# Patient Record
Sex: Female | Born: 1950 | Race: White | Hispanic: No | Smoking: Former smoker
Health system: Southern US, Community
[De-identification: ages and names within clinical notes are randomized; demographics above are authoritative.]

## PROBLEM LIST (undated history)

## (undated) DIAGNOSIS — K5730 Diverticulosis of large intestine without perforation or abscess without bleeding: Secondary | ICD-10-CM

## (undated) DIAGNOSIS — Q676 Pectus excavatum: Secondary | ICD-10-CM

## (undated) DIAGNOSIS — Z1509 Genetic susceptibility to other malignant neoplasm: Secondary | ICD-10-CM

## (undated) DIAGNOSIS — M8580 Other specified disorders of bone density and structure, unspecified site: Secondary | ICD-10-CM

## (undated) DIAGNOSIS — C801 Malignant (primary) neoplasm, unspecified: Secondary | ICD-10-CM

## (undated) DIAGNOSIS — N83209 Unspecified ovarian cyst, unspecified side: Secondary | ICD-10-CM

## (undated) DIAGNOSIS — D869 Sarcoidosis, unspecified: Secondary | ICD-10-CM

## (undated) DIAGNOSIS — D649 Anemia, unspecified: Secondary | ICD-10-CM

## (undated) DIAGNOSIS — M810 Age-related osteoporosis without current pathological fracture: Secondary | ICD-10-CM

## (undated) DIAGNOSIS — M4850XA Collapsed vertebra, not elsewhere classified, site unspecified, initial encounter for fracture: Secondary | ICD-10-CM

## (undated) HISTORY — DX: Pectus excavatum: Q67.6

## (undated) HISTORY — DX: Anemia, unspecified: D64.9

## (undated) HISTORY — DX: Diverticulosis of large intestine without perforation or abscess without bleeding: K57.30

## (undated) HISTORY — DX: Other specified disorders of bone density and structure, unspecified site: M85.80

## (undated) HISTORY — PX: BREAST SURGERY: SHX581

## (undated) HISTORY — PX: TONSILLECTOMY: SUR1361

## (undated) HISTORY — PX: OTHER SURGICAL HISTORY: SHX169

## (undated) HISTORY — DX: Sarcoidosis, unspecified: D86.9

## (undated) HISTORY — DX: Malignant (primary) neoplasm, unspecified: C80.1

## (undated) HISTORY — DX: Collapsed vertebra, not elsewhere classified, site unspecified, initial encounter for fracture: M48.50XA

## (undated) HISTORY — DX: Unspecified ovarian cyst, unspecified side: N83.209

## (undated) HISTORY — DX: Genetic susceptibility to other malignant neoplasm: Z15.09

## (undated) HISTORY — PX: WRIST SURGERY: SHX841

---
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
## 1988-11-19 HISTORY — PX: OOPHORECTOMY: SHX86

## 2001-03-05 ENCOUNTER — Other Ambulatory Visit: Admission: RE | Admit: 2001-03-05 | Discharge: 2001-03-05 | Payer: Self-pay | Admitting: Obstetrics and Gynecology

## 2002-03-11 ENCOUNTER — Other Ambulatory Visit: Admission: RE | Admit: 2002-03-11 | Discharge: 2002-03-11 | Payer: Self-pay | Admitting: Obstetrics and Gynecology

## 2003-04-12 ENCOUNTER — Other Ambulatory Visit: Admission: RE | Admit: 2003-04-12 | Discharge: 2003-04-12 | Payer: Self-pay | Admitting: Obstetrics and Gynecology

## 2003-08-03 ENCOUNTER — Encounter: Payer: Self-pay | Admitting: Internal Medicine

## 2004-04-20 ENCOUNTER — Other Ambulatory Visit: Admission: RE | Admit: 2004-04-20 | Discharge: 2004-04-20 | Payer: Self-pay | Admitting: Obstetrics and Gynecology

## 2005-01-22 ENCOUNTER — Ambulatory Visit: Payer: Self-pay | Admitting: Internal Medicine

## 2005-01-29 ENCOUNTER — Ambulatory Visit: Payer: Self-pay | Admitting: Internal Medicine

## 2005-06-06 ENCOUNTER — Other Ambulatory Visit: Admission: RE | Admit: 2005-06-06 | Discharge: 2005-06-06 | Payer: Self-pay | Admitting: Obstetrics and Gynecology

## 2005-08-20 ENCOUNTER — Ambulatory Visit: Payer: Self-pay | Admitting: Internal Medicine

## 2005-08-27 ENCOUNTER — Ambulatory Visit: Payer: Self-pay | Admitting: Internal Medicine

## 2005-12-07 ENCOUNTER — Ambulatory Visit: Payer: Self-pay | Admitting: Gastroenterology

## 2005-12-12 ENCOUNTER — Ambulatory Visit: Payer: Self-pay | Admitting: Internal Medicine

## 2005-12-17 ENCOUNTER — Ambulatory Visit: Payer: Self-pay | Admitting: Gastroenterology

## 2005-12-17 ENCOUNTER — Encounter: Payer: Self-pay | Admitting: Internal Medicine

## 2005-12-17 HISTORY — PX: COLONOSCOPY: SHX174

## 2006-01-02 ENCOUNTER — Ambulatory Visit: Payer: Self-pay | Admitting: Internal Medicine

## 2006-01-09 ENCOUNTER — Ambulatory Visit: Payer: Self-pay | Admitting: Cardiology

## 2006-04-13 IMAGING — CT CT CHEST W/ CM
2 of 4 series · 15 of 36 positions shown, 18 images · IV contrast (omnipaque)
Comparison: None.

CLINICAL DATA: Possible sarcoidosis.  
 CHEST CT WITH CONTRAST:
TECHNIQUE: Multidetector CT imaging of the chest was performed following the standard protocol during bolus administration of intravenous contrast.
 Contrast:  80 cc Omnipaque 300

[Series 2: chest_routine 5.0 b40f st · axial · 0.62mm/px · z∈[-316,-46]mm · 12 of 64 slices shown, 15 images]
[im 5/64  mediastinal]
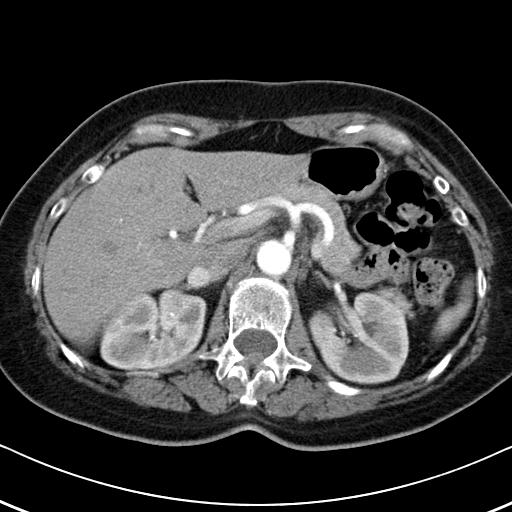
[im 5/64  lung]
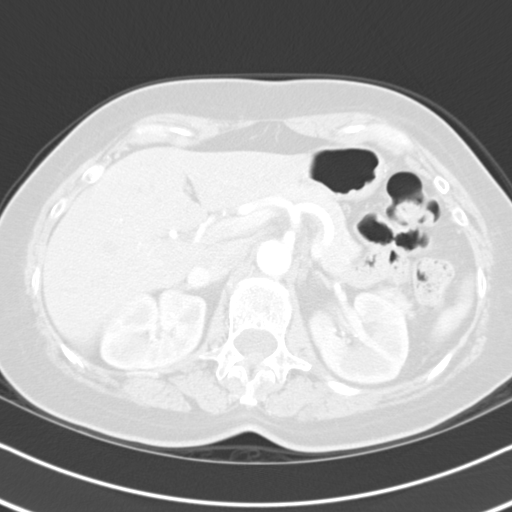
[im 10/64  lung]
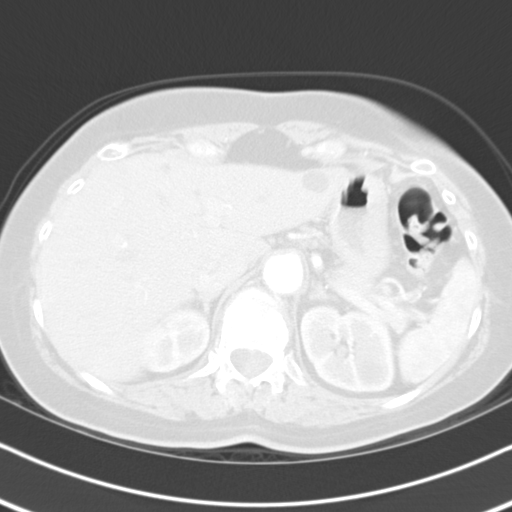
[im 14/64  lung]
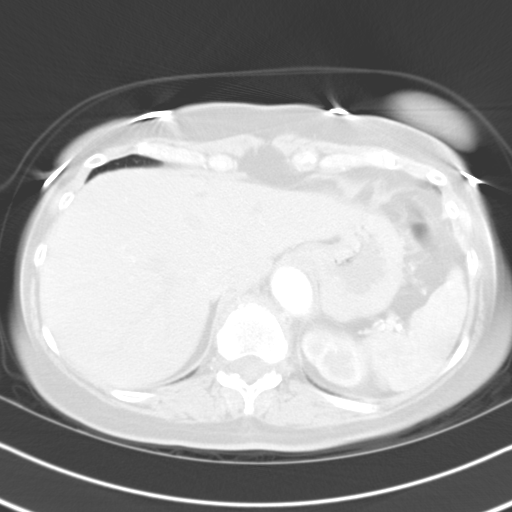
[im 19/64  lung]
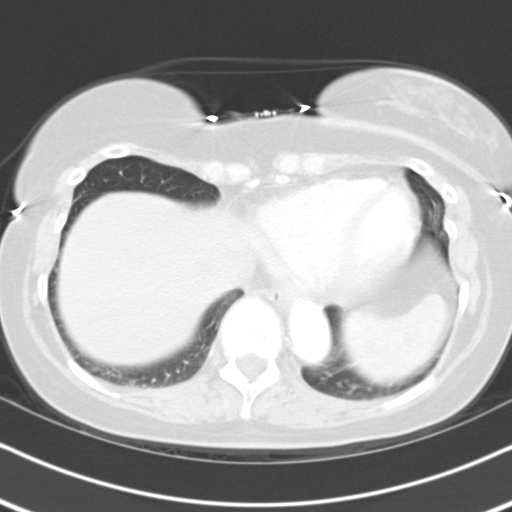
[im 23/64  mediastinal]
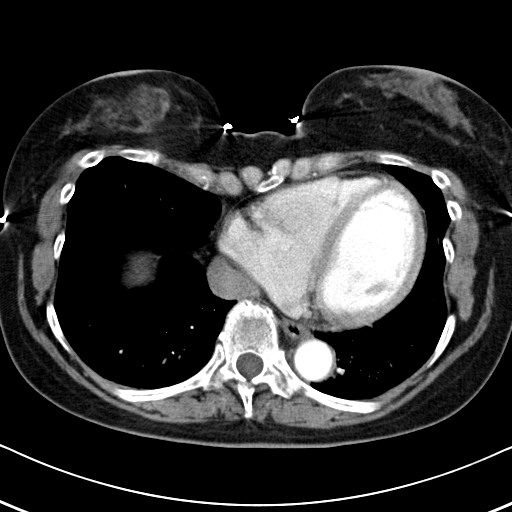
[im 23/64  lung]
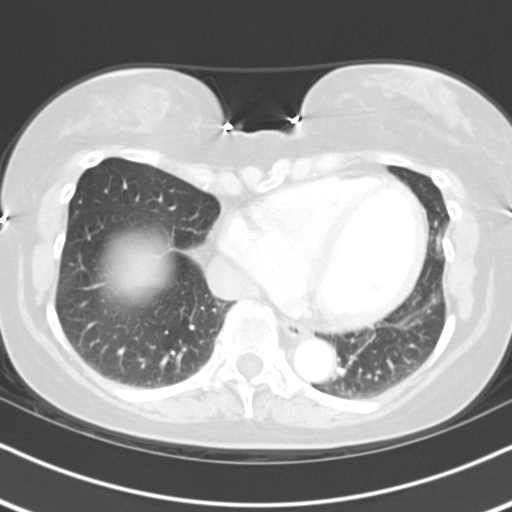
[im 28/64  lung]
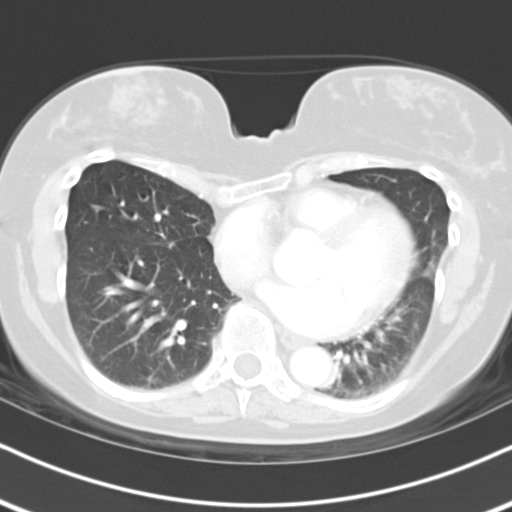
[im 37/64  lung]
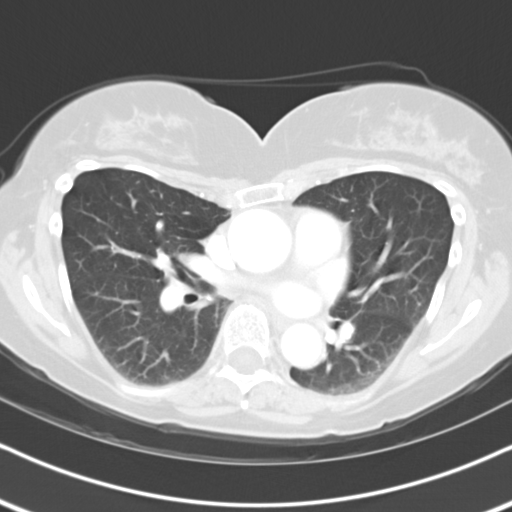
[im 41/64  lung]
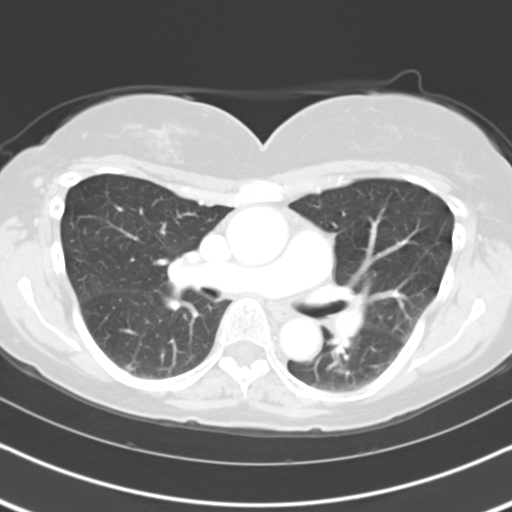
[im 46/64  mediastinal]
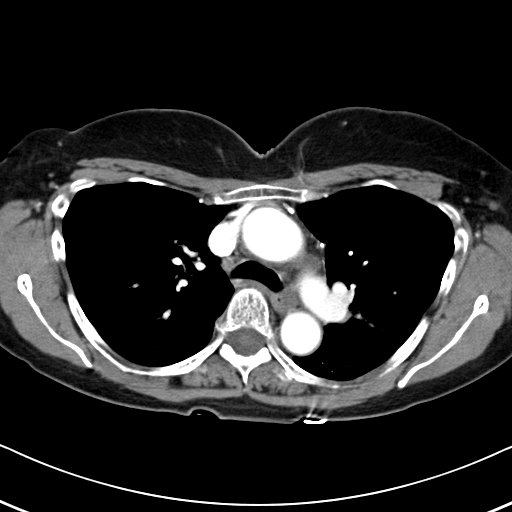
[im 46/64  lung]
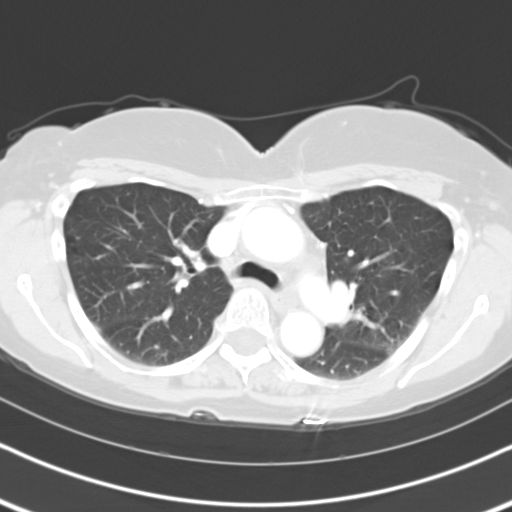
[im 50/64  lung]
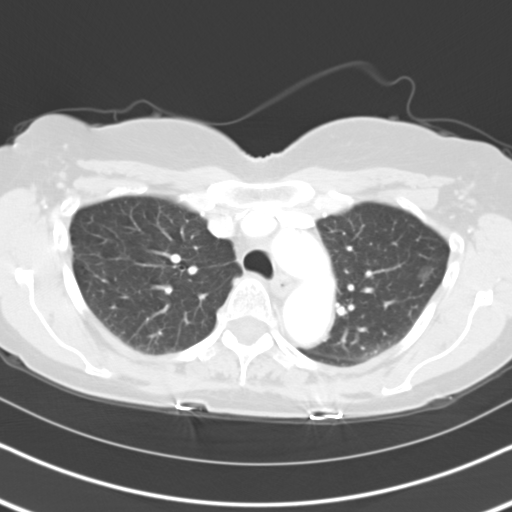
[im 55/64  lung]
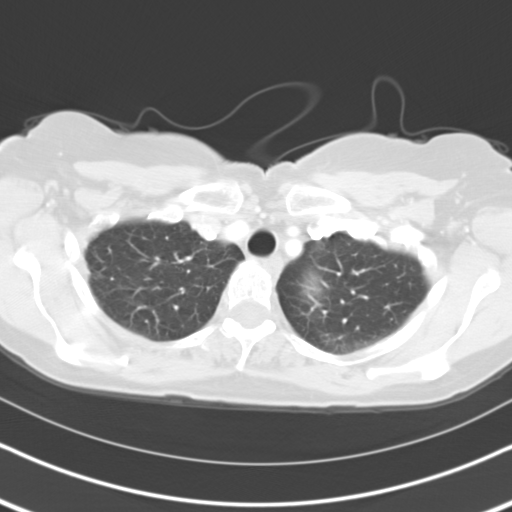
[im 59/64  lung]
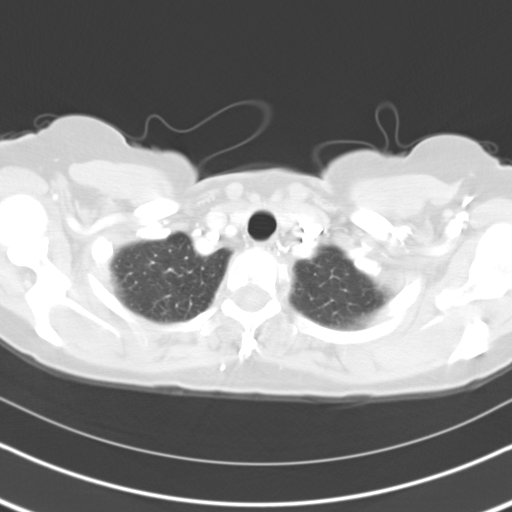

[Series 602: <mpr range> · coronal · 0.64mm/px · 3 of 39 slices shown]
[im 8/39  lung]
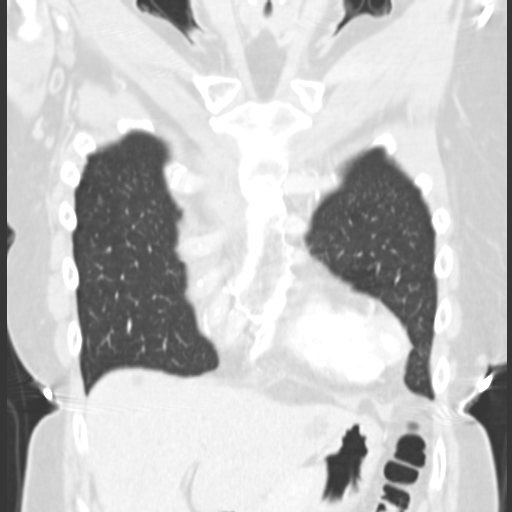
[im 16/39  lung]
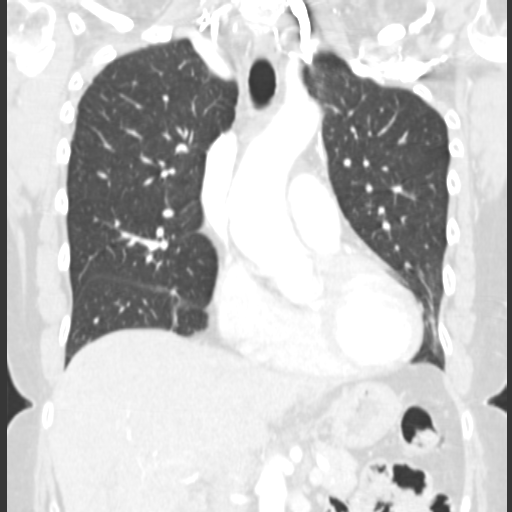
[im 23/39  lung]
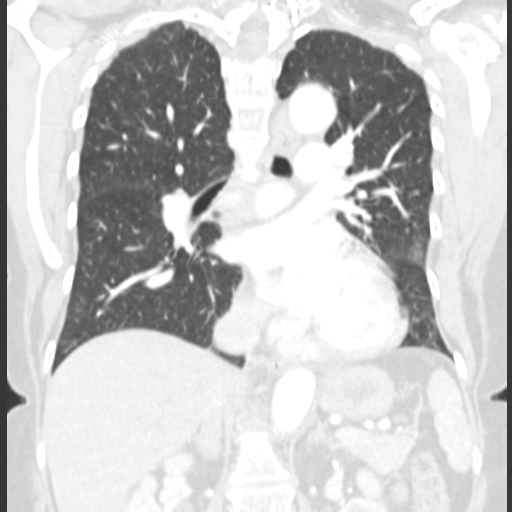

[15 of 36 positions shown; findings below may reference images not displayed]

FINDINGS: There is a rather marked pectus excavatum deformity.  There is no mediastinal adenopathy.  No pleural or pericardial fluid.  There are no interstitial changes that would suggest sarcoidosis.  On images 15 and 16, there is a faint 1 cm density in the left upper lobe that has a somewhat rectangular shape.  There is some minimal ground glass opacity anterior to the major fissure on the left, seen on images 31 through 38.  There are also some lesser ground glass opacities seen in the periphery of the left lower lobe.  This is a nonspecific finding.  It may be in part due to compressive atelectasis from deformity of the left chest due to the pectus excavatum abnormality.  I see no evidence for interstitial lung disease to suggest sarcoid.  
 Some of the upper abdomen is included on the lowest cuts.  There appear to be multiple simple cysts of the liver. 
 The vague density in the left upper lobe is unlikely to be a mass, but it is an abnormal finding and needs short term followup.  At the same time, we could follow the ground glass opacities in the left lung along the major fissure and in the left lower lobe.  My recommendation would be to repeat this study in three months.   I see no need to use IV contrast.
IMPRESSION: 1.  There are no CT changes that would suggest sarcoidosis.  
 2.  Marked pectus deformity. 
 3.  There are some vague left lung densities that are for the most part nonspecific.  However, there is an ill defined density in the left upper lobe that needs followup.  See above.

## 2006-06-17 ENCOUNTER — Other Ambulatory Visit: Admission: RE | Admit: 2006-06-17 | Discharge: 2006-06-17 | Payer: Self-pay | Admitting: Obstetrics and Gynecology

## 2006-08-16 ENCOUNTER — Ambulatory Visit: Payer: Self-pay | Admitting: Internal Medicine

## 2006-09-13 ENCOUNTER — Ambulatory Visit: Payer: Self-pay | Admitting: Internal Medicine

## 2007-03-10 ENCOUNTER — Ambulatory Visit: Payer: Self-pay | Admitting: Internal Medicine

## 2007-03-17 ENCOUNTER — Ambulatory Visit: Payer: Self-pay | Admitting: Internal Medicine

## 2007-06-26 ENCOUNTER — Other Ambulatory Visit: Admission: RE | Admit: 2007-06-26 | Discharge: 2007-06-26 | Payer: Self-pay | Admitting: Obstetrics and Gynecology

## 2007-07-17 DIAGNOSIS — J309 Allergic rhinitis, unspecified: Secondary | ICD-10-CM | POA: Insufficient documentation

## 2007-07-17 DIAGNOSIS — D869 Sarcoidosis, unspecified: Secondary | ICD-10-CM

## 2007-09-08 ENCOUNTER — Ambulatory Visit: Payer: Self-pay | Admitting: Internal Medicine

## 2007-09-08 LAB — CONVERTED CEMR LAB
ALT: 17 units/L (ref 0–35)
Albumin: 4.3 g/dL (ref 3.5–5.2)
Alkaline Phosphatase: 54 units/L (ref 39–117)
Basophils Relative: 1 % (ref 0.0–1.0)
Blood in Urine, dipstick: NEGATIVE
CO2: 33 meq/L — ABNORMAL HIGH (ref 19–32)
Chloride: 110 meq/L (ref 96–112)
Cholesterol: 161 mg/dL (ref 0–200)
Creatinine, Ser: 0.8 mg/dL (ref 0.4–1.2)
Eosinophils Relative: 1.2 % (ref 0.0–5.0)
GFR calc Af Amer: 95 mL/min
Glucose, Bld: 85 mg/dL (ref 70–99)
Glucose, Urine, Semiquant: NEGATIVE
Lymphocytes Relative: 28 % (ref 12.0–46.0)
MCHC: 33.1 g/dL (ref 30.0–36.0)
Nitrite: NEGATIVE
Platelets: 235 10*3/uL (ref 150–400)
RDW: 13.9 % (ref 11.5–14.6)
Sodium: 149 meq/L — ABNORMAL HIGH (ref 135–145)
TSH: 2.21 microintl units/mL (ref 0.35–5.50)
Total Bilirubin: 0.7 mg/dL (ref 0.3–1.2)
Total CHOL/HDL Ratio: 2.5
Total Protein: 6.9 g/dL (ref 6.0–8.3)
Urobilinogen, UA: 0.2
WBC Urine, dipstick: NEGATIVE
pH: 6

## 2007-09-17 ENCOUNTER — Ambulatory Visit: Payer: Self-pay | Admitting: Internal Medicine

## 2007-09-17 DIAGNOSIS — K5730 Diverticulosis of large intestine without perforation or abscess without bleeding: Secondary | ICD-10-CM

## 2007-09-17 HISTORY — DX: Diverticulosis of large intestine without perforation or abscess without bleeding: K57.30

## 2008-06-30 ENCOUNTER — Other Ambulatory Visit: Admission: RE | Admit: 2008-06-30 | Discharge: 2008-06-30 | Payer: Self-pay | Admitting: Obstetrics and Gynecology

## 2008-07-21 ENCOUNTER — Ambulatory Visit: Payer: Self-pay | Admitting: Obstetrics and Gynecology

## 2008-09-09 ENCOUNTER — Ambulatory Visit: Payer: Self-pay | Admitting: Internal Medicine

## 2008-09-09 LAB — CONVERTED CEMR LAB
AST: 22 units/L (ref 0–37)
Albumin: 4.3 g/dL (ref 3.5–5.2)
Basophils Relative: 0.4 % (ref 0.0–3.0)
Bilirubin, Direct: 0.1 mg/dL (ref 0.0–0.3)
Chloride: 105 meq/L (ref 96–112)
Cholesterol: 146 mg/dL (ref 0–200)
Eosinophils Absolute: 0.1 10*3/uL (ref 0.0–0.7)
Eosinophils Relative: 2.8 % (ref 0.0–5.0)
GFR calc non Af Amer: 109 mL/min
HDL: 66 mg/dL (ref >39.0)
Ketones, urine, test strip: NEGATIVE
MCHC: 33.6 g/dL (ref 30.0–36.0)
Monocytes Absolute: 0.4 10*3/uL (ref 0.1–1.0)
Monocytes Relative: 10.2 % (ref 3.0–12.0)
Neutro Abs: 1.8 10*3/uL (ref 1.4–7.7)
Neutrophils Relative %: 43.5 % (ref 43.0–77.0)
Protein, U semiquant: NEGATIVE
RDW: 13.5 % (ref 11.5–14.6)
Sodium: 141 meq/L (ref 135–145)
TSH: 1.68 microintl units/mL (ref 0.35–5.50)
VLDL: 12 mg/dL (ref 0–40)
Vit D, 1,25-Dihydroxy: 40 (ref 30–89)
WBC: 4.1 10*3/uL — ABNORMAL LOW (ref 4.5–10.5)
pH: 6

## 2008-09-16 ENCOUNTER — Ambulatory Visit: Payer: Self-pay | Admitting: Internal Medicine

## 2008-09-29 ENCOUNTER — Encounter: Payer: Self-pay | Admitting: Internal Medicine

## 2009-03-17 ENCOUNTER — Ambulatory Visit: Payer: Self-pay | Admitting: Internal Medicine

## 2009-07-01 ENCOUNTER — Ambulatory Visit: Payer: Self-pay | Admitting: Obstetrics and Gynecology

## 2009-07-01 ENCOUNTER — Other Ambulatory Visit: Admission: RE | Admit: 2009-07-01 | Discharge: 2009-07-01 | Payer: Self-pay | Admitting: Obstetrics and Gynecology

## 2009-07-01 ENCOUNTER — Encounter: Payer: Self-pay | Admitting: Obstetrics and Gynecology

## 2009-08-03 ENCOUNTER — Telehealth: Payer: Self-pay | Admitting: Internal Medicine

## 2009-09-14 ENCOUNTER — Ambulatory Visit: Payer: Self-pay | Admitting: Internal Medicine

## 2009-09-14 LAB — CONVERTED CEMR LAB
AST: 23 units/L (ref 0–37)
Albumin: 4.3 g/dL (ref 3.5–5.2)
Alkaline Phosphatase: 57 units/L (ref 39–117)
BUN: 19 mg/dL (ref 6–23)
Basophils Absolute: 0.1 10*3/uL (ref 0.0–0.1)
Bilirubin Urine: NEGATIVE
CO2: 29 meq/L (ref 19–32)
Calcium: 9 mg/dL (ref 8.4–10.5)
Cholesterol: 152 mg/dL (ref 0–200)
Eosinophils Absolute: 0.1 10*3/uL (ref 0.0–0.7)
GFR calc non Af Amer: 90.99 mL/min (ref >60)
Glucose, Bld: 87 mg/dL (ref 70–99)
Glucose, Urine, Semiquant: NEGATIVE
HDL: 63.8 mg/dL (ref >39.00)
Hemoglobin: 12.6 g/dL (ref 12.0–15.0)
Lymphocytes Relative: 23.8 % (ref 12.0–46.0)
Lymphs Abs: 1.2 10*3/uL (ref 0.7–4.0)
MCHC: 33.2 g/dL (ref 30.0–36.0)
Monocytes Relative: 8.1 % (ref 3.0–12.0)
Neutro Abs: 3.2 10*3/uL (ref 1.4–7.7)
Platelets: 219 10*3/uL (ref 150.0–400.0)
RDW: 14.1 % (ref 11.5–14.6)
TSH: 1.34 microintl units/mL (ref 0.35–5.50)
Total Bilirubin: 1 mg/dL (ref 0.3–1.2)
Triglycerides: 69 mg/dL (ref 0.0–149.0)
VLDL: 13.8 mg/dL (ref 0.0–40.0)
WBC Urine, dipstick: NEGATIVE
pH: 5.5

## 2009-09-23 ENCOUNTER — Ambulatory Visit: Payer: Self-pay | Admitting: Internal Medicine

## 2010-03-27 ENCOUNTER — Ambulatory Visit: Payer: Self-pay | Admitting: Internal Medicine

## 2010-03-27 LAB — CONVERTED CEMR LAB: Angiotensin 1 Converting Enzyme: 26 units/L (ref 9–67)

## 2010-07-10 ENCOUNTER — Other Ambulatory Visit: Admission: RE | Admit: 2010-07-10 | Discharge: 2010-07-10 | Payer: Self-pay | Admitting: Obstetrics and Gynecology

## 2010-07-10 ENCOUNTER — Ambulatory Visit: Payer: Self-pay | Admitting: Obstetrics and Gynecology

## 2010-09-20 ENCOUNTER — Ambulatory Visit: Payer: Self-pay | Admitting: Obstetrics and Gynecology

## 2010-10-31 ENCOUNTER — Encounter: Payer: Self-pay | Admitting: Internal Medicine

## 2010-12-21 NOTE — Letter (Signed)
[facility]   Imported By: Lennie Odor 11/08/2010 14:08:07  _____________________________________________________________________  External Attachment:    Type:   Image     Comment:   External Document

## 2011-06-07 ENCOUNTER — Telehealth: Payer: Self-pay | Admitting: Internal Medicine

## 2011-06-07 NOTE — Telephone Encounter (Signed)
Mackenzie Norton, this pt has a physical on Oct 1 with labs on 9/24. She told me that in addition to her regular physical labs, Dr. Lovell Sheehan also usually orders a sed rate. I did not put that in the appt notes for lab, but can you check with him and have him add it if necessary?

## 2011-06-07 NOTE — Telephone Encounter (Signed)
Added to labs

## 2011-08-13 ENCOUNTER — Other Ambulatory Visit (INDEPENDENT_AMBULATORY_CARE_PROVIDER_SITE_OTHER): Payer: 59

## 2011-08-13 DIAGNOSIS — Z0000 Encounter for general adult medical examination without abnormal findings: Secondary | ICD-10-CM

## 2011-08-13 LAB — CBC WITH DIFFERENTIAL/PLATELET
Basophils Absolute: 0 10*3/uL (ref 0.0–0.1)
Basophils Relative: 0.5 % (ref 0.0–3.0)
Eosinophils Absolute: 0 10*3/uL (ref 0.0–0.7)
HCT: 39.5 % (ref 36.0–46.0)
Hemoglobin: 12.7 g/dL (ref 12.0–15.0)
Lymphocytes Relative: 22.7 % (ref 12.0–46.0)
Lymphs Abs: 1.5 10*3/uL (ref 0.7–4.0)
MCHC: 32.1 g/dL (ref 30.0–36.0)
MCV: 83.6 fl (ref 78.0–100.0)
Monocytes Absolute: 0.5 10*3/uL (ref 0.1–1.0)
Neutro Abs: 4.6 10*3/uL (ref 1.4–7.7)
RBC: 4.73 Mil/uL (ref 3.87–5.11)
RDW: 15.1 % — ABNORMAL HIGH (ref 11.5–14.6)

## 2011-08-13 LAB — SEDIMENTATION RATE: Sed Rate: 7 mm/hr (ref 0–22)

## 2011-08-13 LAB — POCT URINALYSIS DIPSTICK
Spec Grav, UA: 1.03
pH, UA: 5.5

## 2011-08-14 LAB — LIPID PANEL
Cholesterol: 149 mg/dL (ref 0–200)
HDL: 68.2 mg/dL (ref >39.00)

## 2011-08-14 LAB — HEPATIC FUNCTION PANEL
Albumin: 4.6 g/dL (ref 3.5–5.2)
Alkaline Phosphatase: 52 U/L (ref 39–117)
Total Protein: 7.5 g/dL (ref 6.0–8.3)

## 2011-08-14 LAB — BASIC METABOLIC PANEL
Calcium: 9.4 mg/dL (ref 8.4–10.5)
GFR: 76.39 mL/min (ref >60.00)
Potassium: 4.3 mEq/L (ref 3.5–5.1)
Sodium: 141 mEq/L (ref 135–145)

## 2011-08-17 ENCOUNTER — Encounter: Payer: Self-pay | Admitting: Internal Medicine

## 2011-08-20 ENCOUNTER — Encounter: Payer: Self-pay | Admitting: Internal Medicine

## 2011-08-20 ENCOUNTER — Ambulatory Visit (INDEPENDENT_AMBULATORY_CARE_PROVIDER_SITE_OTHER): Payer: 59 | Admitting: Internal Medicine

## 2011-08-20 DIAGNOSIS — Z0000 Encounter for general adult medical examination without abnormal findings: Secondary | ICD-10-CM

## 2011-08-20 DIAGNOSIS — Z23 Encounter for immunization: Secondary | ICD-10-CM

## 2011-08-20 NOTE — Progress Notes (Signed)
  Subjective:    Patient ID: Mackenzie Norton, female    DOB: 1950/11/27, 60 y.o.   MRN: [id number]  HPI cpx She has no acute complaints she does have a history of sarcoidosis allergic rhinitis a history of an ovarian cyst and history of osteopenia for which she receives monitoring for a bone density every 2 years    Review of Systems  Constitutional: Negative for activity change, appetite change and fatigue.  HENT: Negative for ear pain, congestion, neck pain, postnasal drip and sinus pressure.   Eyes: Negative for redness and visual disturbance.  Respiratory: Negative for cough, shortness of breath and wheezing.   Gastrointestinal: Negative for abdominal pain and abdominal distention.  Genitourinary: Negative for dysuria, frequency and menstrual problem.  Musculoskeletal: Negative for myalgias, joint swelling and arthralgias.  Skin: Negative for rash and wound.  Neurological: Negative for dizziness, weakness and headaches.  Hematological: Negative for adenopathy. Does not bruise/bleed easily.  Psychiatric/Behavioral: Negative for sleep disturbance and decreased concentration.   Past Medical History  Diagnosis Date  . Allergic rhinitis   . Sarcoidosis   . Diverticulosis of colon    Past Surgical History  Procedure Date  . Colonoscopy 12/17/05  . Oophorectomy 1990  . Tonsillectomy   . Mole removal and skin cancer surgery at [facility]     reports that she quit smoking about 31 years ago. Her smoking use included Cigarettes. She has never used smokeless tobacco. She reports that she drinks about 2.5 ounces of alcohol per week. She reports that she does not use illicit drugs. family history includes Arthritis in her mother; Cancer in her father; and Parkinsonism in her mother. Allergies  Allergen Reactions  . Penicillins     REACTION: ??       Objective:   Physical Exam  Nursing note and vitals reviewed. Constitutional: She is oriented to person, place, and time. She appears  well-developed and well-nourished. No distress.  HENT:  Head: Normocephalic and atraumatic.  Right Ear: External ear normal.  Left Ear: External ear normal.  Nose: Nose normal.  Mouth/Throat: Oropharynx is clear and moist.  Eyes: Conjunctivae and EOM are normal. Pupils are equal, round, and reactive to light.  Neck: Normal range of motion. Neck supple. No JVD present. No tracheal deviation present. No thyromegaly present.  Cardiovascular: Normal rate, regular rhythm, normal heart sounds and intact distal pulses.   No murmur heard. Pulmonary/Chest: Effort normal and breath sounds normal. She has no wheezes. She exhibits no tenderness.  Abdominal: Soft. Bowel sounds are normal.  Musculoskeletal: Normal range of motion. She exhibits no edema.  Lymphadenopathy:    She has no cervical adenopathy.  Neurological: She is alert and oriented to person, place, and time. She has normal reflexes. No cranial nerve deficit.  Skin: Skin is warm and dry. She is not diaphoretic.  Psychiatric: She has a normal mood and affect. Her behavior is normal.          Assessment & Plan:   This is a routine physical examination for this healthy  Female. Reviewed all health maintenance protocols including mammography colonoscopy bone density and reviewed appropriate screening labs. Her immunization history was reviewed as well as her current medications and allergies refills of her chronic medications were given and the plan for yearly health maintenance was discussed all orders and referrals were made as appropriate.

## 2011-09-05 ENCOUNTER — Encounter: Payer: Self-pay | Admitting: Obstetrics and Gynecology

## 2011-09-14 ENCOUNTER — Encounter: Payer: Self-pay | Admitting: Gynecology

## 2011-09-14 DIAGNOSIS — M8580 Other specified disorders of bone density and structure, unspecified site: Secondary | ICD-10-CM | POA: Insufficient documentation

## 2011-09-14 DIAGNOSIS — N83209 Unspecified ovarian cyst, unspecified side: Secondary | ICD-10-CM | POA: Insufficient documentation

## 2011-09-24 ENCOUNTER — Encounter: Payer: 59 | Admitting: Obstetrics and Gynecology

## 2011-11-19 ENCOUNTER — Other Ambulatory Visit (HOSPITAL_COMMUNITY)
Admission: RE | Admit: 2011-11-19 | Discharge: 2011-11-19 | Disposition: A | Discharge status: Home / Self Care | Payer: 59 | Source: Ambulatory Visit | Attending: Obstetrics and Gynecology | Admitting: Obstetrics and Gynecology

## 2011-11-19 ENCOUNTER — Ambulatory Visit (INDEPENDENT_AMBULATORY_CARE_PROVIDER_SITE_OTHER): Payer: 59 | Admitting: Obstetrics and Gynecology

## 2011-11-19 ENCOUNTER — Encounter: Payer: Self-pay | Admitting: Obstetrics and Gynecology

## 2011-11-19 VITALS — BP 120/76 | Ht 67.0 in | Wt 180.0 lb

## 2011-11-19 DIAGNOSIS — Z01419 Encounter for gynecological examination (general) (routine) without abnormal findings: Secondary | ICD-10-CM | POA: Insufficient documentation

## 2011-11-19 NOTE — Patient Instructions (Signed)
The patient is instructed to continue all medications as prescribed. Schedule followup with check out clerk upon leaving the clinic  

## 2011-11-19 NOTE — Progress Notes (Signed)
Patient came to see me today for her annual GYN exam. She is doing well without health issues. She does her lab through PCP. She is up-to-date on mammograms. Her husband who is 32 is having health problems and this is stressful for her. She does have low bone mass without an elevated FRAX risk. She's had no fractures. She is up-to-date on bone densities. She is not having menopausal symptoms. She is having no vaginal bleeding. She is having no pelvic pain.  Physical examination: Kennon Portela present. HEENT within normal limits. Neck: Thyroid not large. No masses. Supraclavicular nodes: not enlarged. Breasts: Examined in both sitting midline position. No skin changes and no masses. Abdomen: Soft no guarding rebound or masses or hernia. Pelvic: External: Within normal limits. BUS: Within normal limits. Vaginal:within normal limits. Poor estrogen effect. No evidence of cystocele rectocele or enterocele. Cervix: clean. Uterus: Normal size and shape. Adnexa: No masses. Rectovaginal exam: Confirmatory and negative. Extremities: Within normal limits.  Assessment: #1. Atrophic vaginitis #2. Osteopenia  Plan: Continue yearly mammograms. Bone density November 2013.

## 2012-02-18 ENCOUNTER — Other Ambulatory Visit (INDEPENDENT_AMBULATORY_CARE_PROVIDER_SITE_OTHER): Payer: 59

## 2012-02-18 DIAGNOSIS — R52 Pain, unspecified: Secondary | ICD-10-CM

## 2012-02-18 DIAGNOSIS — D869 Sarcoidosis, unspecified: Secondary | ICD-10-CM

## 2012-02-19 NOTE — Progress Notes (Signed)
pt informed

## 2012-04-01 ENCOUNTER — Encounter: Payer: Self-pay | Admitting: Internal Medicine

## 2012-08-15 ENCOUNTER — Other Ambulatory Visit: Payer: 59

## 2012-08-18 ENCOUNTER — Other Ambulatory Visit (INDEPENDENT_AMBULATORY_CARE_PROVIDER_SITE_OTHER): Payer: 59

## 2012-08-18 DIAGNOSIS — Z0000 Encounter for general adult medical examination without abnormal findings: Secondary | ICD-10-CM

## 2012-08-18 LAB — CBC WITH DIFFERENTIAL/PLATELET
Basophils Relative: 0.6 % (ref 0.0–3.0)
Eosinophils Relative: 2 % (ref 0.0–5.0)
HCT: 39.1 % (ref 36.0–46.0)
Hemoglobin: 12.5 g/dL (ref 12.0–15.0)
Lymphs Abs: 1.7 10*3/uL (ref 0.7–4.0)
Monocytes Relative: 9.1 % (ref 3.0–12.0)
Neutro Abs: 2.7 10*3/uL (ref 1.4–7.7)
RBC: 4.72 Mil/uL (ref 3.87–5.11)
RDW: 15.4 % — ABNORMAL HIGH (ref 11.5–14.6)
WBC: 4.9 10*3/uL (ref 4.5–10.5)

## 2012-08-18 LAB — HEPATIC FUNCTION PANEL
Albumin: 4.4 g/dL (ref 3.5–5.2)
Total Bilirubin: 0.9 mg/dL (ref 0.3–1.2)

## 2012-08-18 LAB — BASIC METABOLIC PANEL
GFR: 99.91 mL/min (ref >60.00)
Glucose, Bld: 82 mg/dL (ref 70–99)
Potassium: 3.8 mEq/L (ref 3.5–5.1)
Sodium: 139 mEq/L (ref 135–145)

## 2012-08-18 LAB — POCT URINALYSIS DIPSTICK
Leukocytes, UA: NEGATIVE
Protein, UA: NEGATIVE
Urobilinogen, UA: 0.2
pH, UA: 8

## 2012-08-18 LAB — LIPID PANEL
LDL Cholesterol: 71 mg/dL (ref 0–99)
Total CHOL/HDL Ratio: 2
VLDL: 20.8 mg/dL (ref 0.0–40.0)

## 2012-08-18 LAB — TSH: TSH: 1.8 u[IU]/mL (ref 0.35–5.50)

## 2012-08-22 ENCOUNTER — Ambulatory Visit (INDEPENDENT_AMBULATORY_CARE_PROVIDER_SITE_OTHER): Payer: 59 | Admitting: Internal Medicine

## 2012-08-22 ENCOUNTER — Encounter: Payer: Self-pay | Admitting: Internal Medicine

## 2012-08-22 VITALS — BP 140/80 | HR 72 | Temp 98.2°F | Resp 16 | Ht 69.0 in | Wt 184.0 lb

## 2012-08-22 DIAGNOSIS — Z0000 Encounter for general adult medical examination without abnormal findings: Secondary | ICD-10-CM

## 2012-08-22 NOTE — Progress Notes (Signed)
Subjective:    Patient ID: Mackenzie Norton, female    DOB: 1951/02/27, 61 y.o.   MRN: [id number]  HPI    Review of Systems  Constitutional: Negative for activity change, appetite change and fatigue.  HENT: Negative for ear pain, congestion, neck pain, postnasal drip and sinus pressure.   Eyes: Negative for redness and visual disturbance.  Respiratory: Negative for cough, shortness of breath and wheezing.   Gastrointestinal: Negative for abdominal pain and abdominal distention.  Genitourinary: Negative for dysuria, frequency and menstrual problem.  Musculoskeletal: Negative for myalgias, joint swelling and arthralgias.  Skin: Negative for rash and wound.  Neurological: Negative for dizziness, weakness and headaches.  Hematological: Negative for adenopathy. Does not bruise/bleed easily.  Psychiatric/Behavioral: Negative for disturbed wake/sleep cycle and decreased concentration.   Past Medical History  Diagnosis Date  . Allergic rhinitis   . Sarcoidosis   . Diverticulosis of colon   . Ovarian cyst     Left and Right  . Osteopenia   . Vertebral compression fracture     L-6    History   Social History  . Marital Status: Married    Spouse Name: N/A    Number of Children: N/A  . Years of Education: N/A   Occupational History  . Not on file.   Social History Main Topics  . Smoking status: Former Smoker    Types: Cigarettes    Quit date: 08/19/1980  . Smokeless tobacco: Never Used  . Alcohol Use: 2.5 oz/week    5 drink(s) per week  . Drug Use: No  . Sexually Active: No   Other Topics Concern  . Not on file   Social History Narrative  . No narrative on file    Past Surgical History  Procedure Date  . Colonoscopy 12/17/05  . Tonsillectomy   . Mole removal and skin cancer surgery at [facility]   . Oophorectomy 1990    LSO  . Wrist surgery     Family History  Problem Relation Age of Onset  . Arthritis Mother   . Parkinsonism Mother   . Cancer Father    lymphoma  CLL  . Breast cancer Maternal Grandmother   . Heart disease Maternal Grandmother   . Heart disease Maternal Grandfather   . Breast cancer Paternal Grandmother   . Cancer Paternal Grandfather     Esophageal cancer    Allergies  Allergen Reactions  . Penicillins     REACTION: ??    Current Outpatient Prescriptions on File Prior to Visit  Medication Sig Dispense Refill  . aspirin 81 MG chewable tablet Chew 81 mg by mouth daily.        . B Complex Vitamins (B COMPLEX 1 PO) Take by mouth daily.        . Biotin 10 MG TABS Take by mouth daily.        . Calcium-Vitamin D 600-200 MG-UNIT per tablet Take 1 tablet by mouth 2 (two) times daily.        . Cholecalciferol (VITAMIN D3) 1000 UNITS CAPS Take 2,000 Units by mouth daily.        . fish oil-omega-3 fatty acids 1000 MG capsule Take 1 g by mouth daily.        . Flaxseed, Linseed, (FLAX SEED OIL) 1000 MG CAPS Take by mouth daily.        . Glucosamine-Chondroit-Vit C-Mn (GLUCOSAMINE CHONDR 500 COMPLEX PO) Take by mouth.        . loratadine (CLARITIN) 10  MG tablet Take 10 mg by mouth daily.        . Magnesium (ESSENTIAL MAGNESIUM) 250 MG TABS Take by mouth daily.        . Nutritional Supplements (JUICE PLUS FIBRE PO) Take by mouth daily.        . Probiotic Product (PROBIOTIC FORMULA PO) Take by mouth daily.          BP 140/80  Pulse 72  Temp 98.2 F (36.8 C)  Resp 16  Ht 5\' 9"  (1.753 m)  Wt 184 lb (83.462 kg)  BMI 27.17 kg/m2        Objective:   Physical Exam  Nursing note and vitals reviewed. Constitutional: She is oriented to person, place, and time. She appears well-developed and well-nourished. No distress.  HENT:  Head: Normocephalic and atraumatic.  Right Ear: External ear normal.  Left Ear: External ear normal.  Nose: Nose normal.  Mouth/Throat: Oropharynx is clear and moist.  Eyes: Conjunctivae normal and EOM are normal. Pupils are equal, round, and reactive to light.  Neck: Normal range of motion. Neck  supple. No JVD present. No tracheal deviation present. No thyromegaly present.  Cardiovascular: Normal rate, regular rhythm, normal heart sounds and intact distal pulses.   No murmur heard. Pulmonary/Chest: Effort normal and breath sounds normal. She has no wheezes. She exhibits no tenderness.  Abdominal: Soft. Bowel sounds are normal.  Musculoskeletal: Normal range of motion. She exhibits no edema and no tenderness.  Lymphadenopathy:    She has no cervical adenopathy.  Neurological: She is alert and oriented to person, place, and time. She has normal reflexes. No cranial nerve deficit.  Skin: Skin is warm and dry. She is not diaphoretic.  Psychiatric: She has a normal mood and affect. Her behavior is normal.          Assessment & Plan:   This is a routine physical examination for this healthy  Female. Reviewed all health maintenance protocols including mammography colonoscopy bone density and reviewed appropriate screening labs. Her immunization history was reviewed as well as her current medications and allergies refills of her chronic medications were given and the plan for yearly health maintenance was discussed all orders and referrals were made as appropriate.

## 2012-08-22 NOTE — Patient Instructions (Addendum)
The patient is instructed to continue all medications as prescribed. Schedule followup with check out clerk upon leaving the clinic paleo diet

## 2012-09-30 ENCOUNTER — Encounter: Payer: Self-pay | Admitting: Obstetrics and Gynecology

## 2012-11-03 ENCOUNTER — Ambulatory Visit (INDEPENDENT_AMBULATORY_CARE_PROVIDER_SITE_OTHER): Payer: 59 | Admitting: Obstetrics and Gynecology

## 2012-11-03 ENCOUNTER — Encounter: Payer: Self-pay | Admitting: Obstetrics and Gynecology

## 2012-11-03 VITALS — BP 124/76 | Ht 68.0 in | Wt 182.0 lb

## 2012-11-03 DIAGNOSIS — Z01419 Encounter for gynecological examination (general) (routine) without abnormal findings: Secondary | ICD-10-CM

## 2012-11-03 DIAGNOSIS — R3915 Urgency of urination: Secondary | ICD-10-CM

## 2012-11-03 LAB — URINALYSIS W MICROSCOPIC + REFLEX CULTURE
Bilirubin Urine: NEGATIVE
Ketones, ur: NEGATIVE mg/dL
Protein, ur: NEGATIVE mg/dL
Urobilinogen, UA: 0.2 mg/dL (ref 0.0–1.0)

## 2012-11-03 NOTE — Progress Notes (Signed)
Patient came to see me today for her annual GYN exam. For the past week she has had urinary urgency without dysuria or frequency. This is a new complaint. Her urinalysis today showed a lot of red blood cells but she irritated herself during the collection and bled a  little bit. She is having no vaginal bleeding. She is having no pelvic pain. She has always had normal Pap smears. Her last Pap smear was 2012. She does her lab through PCP. Her last bone density was 2011. She has osteopenia without an elevated fracture risk. She has had a left salpingo-oophorectomy in 1999 for benign ovarian cysts. She is doing well without hormone replacement therapy. She has a history of both the maternal and paternal grandmothers who had breast cancer at approximately 62 years old. She cannot pin it down any more in terms of their exact ages. She is up-to-date on mammograms.  Physical examination:Kim Julian Reil present. HEENT within normal limits. Neck: Thyroid not large. No masses. Supraclavicular nodes: not enlarged. Breasts: Examined in both sitting and lying  position. No skin changes and no masses. Abdomen: Soft no guarding rebound or masses or hernia. Pelvic: External: Within normal limits. BUS: Within normal limits. Vaginal:within normal limits. Poor  estrogen effect. No evidence of cystocele rectocele or enterocele. Cervix: clean. Uterus: Normal size and shape. Adnexa: No masses. Rectovaginal exam: Confirmatory and negative. Extremities: Within normal limits.  Assessment: #1. Urinary urgency #2. Osteopenia #3. Family history of early onset breast cancer  Plan: Urine culture done. We will await  results before treatment. Bone density. Continue yearly mammograms. Genetic testing for breast cancer. Pap not done.The new Pap smear guidelines were discussed with the patient.

## 2012-11-03 NOTE — Patient Instructions (Addendum)
Schedule bone density. We will call you with urine culture results. Continue yearly mammograms. Genetic counseling at [facility] for BRCA1 and BRCA2.

## 2012-11-05 LAB — URINE CULTURE: Colony Count: 75000

## 2012-11-07 ENCOUNTER — Other Ambulatory Visit: Payer: Self-pay | Admitting: *Deleted

## 2012-11-07 DIAGNOSIS — N390 Urinary tract infection, site not specified: Secondary | ICD-10-CM

## 2012-11-07 MED ORDER — NITROFURANTOIN MONOHYD MACRO 100 MG PO CAPS: 100.0000 mg | ORAL_CAPSULE | Freq: Two times a day (BID) | ORAL | Status: AC

## 2012-11-18 ENCOUNTER — Encounter: Payer: Self-pay | Admitting: Obstetrics and Gynecology

## 2013-02-20 ENCOUNTER — Ambulatory Visit: Payer: 59 | Admitting: Internal Medicine

## 2014-01-04 ENCOUNTER — Other Ambulatory Visit (INDEPENDENT_AMBULATORY_CARE_PROVIDER_SITE_OTHER): Payer: 59

## 2014-01-04 DIAGNOSIS — Z0000 Encounter for general adult medical examination without abnormal findings: Secondary | ICD-10-CM

## 2014-01-04 DIAGNOSIS — D869 Sarcoidosis, unspecified: Secondary | ICD-10-CM

## 2014-01-04 LAB — CBC WITH DIFFERENTIAL/PLATELET
Basophils Absolute: 0 10*3/uL (ref 0.0–0.1)
Basophils Relative: 0.6 % (ref 0.0–3.0)
EOS ABS: 0.1 10*3/uL (ref 0.0–0.7)
EOS PCT: 2.3 % (ref 0.0–5.0)
HEMATOCRIT: 38.3 % (ref 36.0–46.0)
Hemoglobin: 12.1 g/dL (ref 12.0–15.0)
LYMPHS ABS: 1.6 10*3/uL (ref 0.7–4.0)
Lymphocytes Relative: 39.1 % (ref 12.0–46.0)
MCHC: 31.7 g/dL (ref 30.0–36.0)
MCV: 82.5 fl (ref 78.0–100.0)
MONO ABS: 0.4 10*3/uL (ref 0.1–1.0)
Monocytes Relative: 9.4 % (ref 3.0–12.0)
Neutro Abs: 2 10*3/uL (ref 1.4–7.7)
Neutrophils Relative %: 48.6 % (ref 43.0–77.0)
PLATELETS: 200 10*3/uL (ref 150.0–400.0)
RBC: 4.64 Mil/uL (ref 3.87–5.11)
RDW: 14.6 % (ref 11.5–14.6)
WBC: 4.2 10*3/uL — AB (ref 4.5–10.5)

## 2014-01-04 LAB — BASIC METABOLIC PANEL
BUN: 18 mg/dL (ref 6–23)
CHLORIDE: 107 meq/L (ref 96–112)
CO2: 26 meq/L (ref 19–32)
CREATININE: 0.6 mg/dL (ref 0.4–1.2)
Calcium: 8.8 mg/dL (ref 8.4–10.5)
GFR: 113.69 mL/min (ref >60.00)
GLUCOSE: 84 mg/dL (ref 70–99)
POTASSIUM: 3.8 meq/L (ref 3.5–5.1)
Sodium: 140 mEq/L (ref 135–145)

## 2014-01-04 LAB — POCT URINALYSIS DIPSTICK
BILIRUBIN UA: NEGATIVE
Blood, UA: NEGATIVE
Glucose, UA: NEGATIVE
Ketones, UA: NEGATIVE
LEUKOCYTES UA: NEGATIVE
NITRITE UA: POSITIVE
PH UA: 5.5
Spec Grav, UA: >=1.03
Urobilinogen, UA: 0.2

## 2014-01-04 LAB — HEPATIC FUNCTION PANEL
ALT: 18 U/L (ref 0–35)
AST: 16 U/L (ref 0–37)
Albumin: 4.1 g/dL (ref 3.5–5.2)
Alkaline Phosphatase: 51 U/L (ref 39–117)
BILIRUBIN TOTAL: 0.8 mg/dL (ref 0.3–1.2)
Bilirubin, Direct: 0 mg/dL (ref 0.0–0.3)
TOTAL PROTEIN: 7.1 g/dL (ref 6.0–8.3)

## 2014-01-04 LAB — LIPID PANEL
Cholesterol: 144 mg/dL (ref 0–200)
HDL: 60.4 mg/dL (ref >39.00)
LDL Cholesterol: 70 mg/dL (ref 0–99)
Total CHOL/HDL Ratio: 2
Triglycerides: 70 mg/dL (ref 0.0–149.0)
VLDL: 14 mg/dL (ref 0.0–40.0)

## 2014-01-04 LAB — TSH: TSH: 1.91 u[IU]/mL (ref 0.35–5.50)

## 2014-01-05 LAB — ANGIOTENSIN CONVERTING ENZYME: ANGIOTENSIN-CONVERTING ENZYME: 21 U/L (ref 8–52)

## 2014-01-11 ENCOUNTER — Encounter: Payer: Self-pay | Admitting: Internal Medicine

## 2014-01-11 ENCOUNTER — Ambulatory Visit (INDEPENDENT_AMBULATORY_CARE_PROVIDER_SITE_OTHER): Payer: 59 | Admitting: Internal Medicine

## 2014-01-11 VITALS — BP 130/84 | HR 72 | Temp 98.7°F | Resp 16 | Ht 68.0 in | Wt 190.0 lb

## 2014-01-11 DIAGNOSIS — Z2911 Encounter for prophylactic immunotherapy for respiratory syncytial virus (RSV): Secondary | ICD-10-CM

## 2014-01-11 DIAGNOSIS — H9190 Unspecified hearing loss, unspecified ear: Secondary | ICD-10-CM

## 2014-01-11 DIAGNOSIS — Z0000 Encounter for general adult medical examination without abnormal findings: Secondary | ICD-10-CM

## 2014-01-11 DIAGNOSIS — N390 Urinary tract infection, site not specified: Secondary | ICD-10-CM

## 2014-01-11 DIAGNOSIS — Z23 Encounter for immunization: Secondary | ICD-10-CM

## 2014-01-11 MED ORDER — CIPROFLOXACIN HCL 250 MG PO TABS: 250.0000 mg | ORAL_TABLET | Freq: Two times a day (BID) | ORAL | Status: DC

## 2014-01-11 NOTE — Patient Instructions (Signed)
The patient is instructed to continue all medications as prescribed. Schedule followup with check out clerk upon leaving the clinic [facility]

## 2014-01-11 NOTE — Progress Notes (Signed)
Pre visit review using our clinic review tool, if applicable. No additional management support is needed unless otherwise documented below in the visit note. 

## 2014-01-11 NOTE — Addendum Note (Signed)
Addended by: Ricard Dillon on: 01/11/2014 03:45 PM   Modules accepted: Orders

## 2014-01-11 NOTE — Progress Notes (Signed)
Subjective:    Patient ID: Mackenzie Norton, female    DOB: 1951/03/03, 63 y.o.   MRN: [id number]  HPI    Review of Systems  Constitutional: Negative for activity change, appetite change and fatigue.  HENT: Negative for congestion, ear pain, postnasal drip and sinus pressure.   Eyes: Negative for redness and visual disturbance.  Respiratory: Negative for cough, shortness of breath and wheezing.   Gastrointestinal: Negative for abdominal pain and abdominal distention.  Genitourinary: Negative for dysuria, frequency and menstrual problem.  Musculoskeletal: Negative for arthralgias, joint swelling, myalgias and neck pain.  Skin: Negative for rash and wound.  Neurological: Negative for dizziness, weakness and headaches.  Hematological: Negative for adenopathy. Does not bruise/bleed easily.  Psychiatric/Behavioral: Negative for sleep disturbance and decreased concentration.   Past Medical History  Diagnosis Date  . Allergic rhinitis   . Sarcoidosis   . Diverticulosis of colon   . Ovarian cyst     Left and Right  . Osteopenia   . Vertebral compression fracture     L-6    History   Social History  . Marital Status: Widowed    Spouse Name: N/A    Number of Children: N/A  . Years of Education: N/A   Occupational History  . Not on file.   Social History Main Topics  . Smoking status: Former Smoker    Types: Cigarettes    Quit date: 08/19/1980  . Smokeless tobacco: Never Used  . Alcohol Use: 2.5 oz/week    5 drink(s) per week  . Drug Use: No  . Sexual Activity: No   Other Topics Concern  . Not on file   Social History Narrative  . No narrative on file    Past Surgical History  Procedure Laterality Date  . Colonoscopy  12/17/05  . Tonsillectomy    . Mole removal and skin cancer surgery at [facility]    . Oophorectomy  1990    LSO  . Wrist surgery      Family History  Problem Relation Age of Onset  . Arthritis Mother   . Parkinsonism Mother   . Cancer Father       lymphoma  CLL  . Breast cancer Maternal Grandmother     Age 57  . Heart disease Maternal Grandmother   . Heart disease Maternal Grandfather   . Breast cancer Paternal Grandmother     Age 16  . Cancer Paternal Grandfather     Esophageal cancer    Allergies  Allergen Reactions  . Penicillins     REACTION: ??    Current Outpatient Prescriptions on File Prior to Visit  Medication Sig Dispense Refill  . aspirin 81 MG chewable tablet Chew 81 mg by mouth daily.        . B Complex Vitamins (B COMPLEX 1 PO) Take by mouth daily.        . Biotin 10 MG TABS Take by mouth daily.        . Calcium-Vitamin D 600-200 MG-UNIT per tablet Take 1 tablet by mouth 2 (two) times daily.        . Cholecalciferol (VITAMIN D3) 1000 UNITS CAPS Take 2,000 Units by mouth daily.        . fish oil-omega-3 fatty acids 1000 MG capsule Take 1 g by mouth daily.        . Glucosamine-Chondroit-Vit C-Mn (GLUCOSAMINE CHONDR 500 COMPLEX PO) Take by mouth.        . loratadine (CLARITIN) 10 MG  tablet Take 10 mg by mouth daily.        . Magnesium (ESSENTIAL MAGNESIUM) 250 MG TABS Take by mouth daily.        . Nutritional Supplements (JUICE PLUS FIBRE PO) Take by mouth daily.        . Probiotic Product (PROBIOTIC FORMULA PO) Take by mouth daily.         No current facility-administered medications on file prior to visit.    BP 130/84  Pulse 72  Temp(Src) 98.7 F (37.1 C)  Resp 16  Ht 5\' 8"  (1.727 m)  Wt 190 lb (86.183 kg)  BMI 28.90 kg/m2        Objective:   Physical Exam  Nursing note and vitals reviewed. Constitutional: She is oriented to person, place, and time. She appears well-developed and well-nourished. No distress.  HENT:  Head: Normocephalic and atraumatic.  Right Ear: External ear normal.  Left Ear: External ear normal.  Nose: Nose normal.  Mouth/Throat: Oropharynx is clear and moist.  Eyes: Conjunctivae and EOM are normal. Pupils are equal, round, and reactive to light.  Neck: Normal  range of motion. Neck supple. No JVD present. No tracheal deviation present. No thyromegaly present.  Cardiovascular: Normal rate, regular rhythm, normal heart sounds and intact distal pulses.   No murmur heard. Pulmonary/Chest: Effort normal and breath sounds normal. She has no wheezes. She exhibits no tenderness.  Abdominal: Soft. Bowel sounds are normal.  Musculoskeletal: Normal range of motion. She exhibits no edema and no tenderness.  Lymphadenopathy:    She has no cervical adenopathy.  Neurological: She is alert and oriented to person, place, and time. She has normal reflexes. No cranial nerve deficit.  Skin: Skin is warm and dry. She is not diaphoretic.  Psychiatric: She has a normal mood and affect. Her behavior is normal.          Assessment & Plan:   Patient presents for yearly preventative medicine examination.   all immunizations and health maintenance protocols were reviewed with the patient and they are up to date with these protocols.   screening laboratory values were reviewed with the patient including screening of hyperlipidemia PSA renal function and hepatic function.   There medications past medical history social history problem list and allergies were reviewed in detail.   Goals were established with regard to weight loss exercise diet in compliance with medications

## 2014-04-26 ENCOUNTER — Other Ambulatory Visit: Payer: Self-pay | Admitting: Dermatology

## 2014-05-11 ENCOUNTER — Encounter: Payer: Self-pay | Admitting: Gynecology

## 2014-06-07 ENCOUNTER — Ambulatory Visit (INDEPENDENT_AMBULATORY_CARE_PROVIDER_SITE_OTHER): Payer: 59 | Admitting: Gynecology

## 2014-06-07 ENCOUNTER — Other Ambulatory Visit (HOSPITAL_COMMUNITY)
Admission: RE | Admit: 2014-06-07 | Discharge: 2014-06-07 | Disposition: A | Discharge status: Home / Self Care | Payer: 59 | Source: Ambulatory Visit | Attending: Gynecology | Admitting: Gynecology

## 2014-06-07 ENCOUNTER — Encounter: Payer: Self-pay | Admitting: Gynecology

## 2014-06-07 VITALS — BP 118/72 | Ht 67.0 in | Wt 190.4 lb

## 2014-06-07 DIAGNOSIS — M899 Disorder of bone, unspecified: Secondary | ICD-10-CM

## 2014-06-07 DIAGNOSIS — Z01419 Encounter for gynecological examination (general) (routine) without abnormal findings: Secondary | ICD-10-CM

## 2014-06-07 DIAGNOSIS — N898 Other specified noninflammatory disorders of vagina: Secondary | ICD-10-CM

## 2014-06-07 DIAGNOSIS — L293 Anogenital pruritus, unspecified: Secondary | ICD-10-CM

## 2014-06-07 DIAGNOSIS — M8580 Other specified disorders of bone density and structure, unspecified site: Secondary | ICD-10-CM

## 2014-06-07 DIAGNOSIS — M949 Disorder of cartilage, unspecified: Secondary | ICD-10-CM

## 2014-06-07 LAB — WET PREP FOR TRICH, YEAST, CLUE
Trich, Wet Prep: NONE SEEN
Yeast Wet Prep HPF POC: NONE SEEN

## 2014-06-07 MED ORDER — FLUCONAZOLE 150 MG PO TABS: 150.0000 mg | ORAL_TABLET | Freq: Once | ORAL | Status: DC

## 2014-06-07 NOTE — Progress Notes (Signed)
Mackenzie Norton 08/13/1951 [id number]        63 y.o.  G0P0 for annual exam.  Former patient Dr. Cherylann Banas. Several issues noted below.  Past medical history,surgical history, problem list, medications, allergies, family history and social history were all reviewed and documented as reviewed in the EPIC chart.  ROS:  12 system ROS performed with pertinent positives and negatives included in the history, assessment and plan.   Additional significant findings :  None   Exam: Journalist, newspaper Filed Vitals:   06/07/14 1049  BP: 118/72  Height: 5\' 7"  (1.702 m)  Weight: 190 lb 6.4 oz (86.365 kg)   General appearance:  Normal affect, orientation and appearance. Skin: Grossly normal HEENT: Without gross lesions.  No cervical or supraclavicular adenopathy. Thyroid normal.  Lungs:  Clear without wheezing, rales or rhonchi Cardiac: RR, without RMG Abdominal:  Soft, nontender, without masses, guarding, rebound, organomegaly or hernia Breasts:  Examined lying and sitting without masses, retractions, discharge or axillary adenopathy. Pelvic:  Ext/BUS/vagina with generalized atrophic changes.  Cervix with atrophic changes  Uterus grossly normal midline mobile. Somewhat difficult to palpate.,   Adnexa  Without masses or tenderness    Anus and perineum  Normal   Rectovaginal  Normal sphincter tone without palpated masses or tenderness.    Assessment/Plan:  63 y.o. G0P0 female for annual exam.   1. Postmenopausal/atrophic genital changes. Patient without significant symptoms of hot flushes, night sweats, vaginal dryness. Is not sexually active. No vaginal bleeding. Continue to monitor. Report any vaginal bleeding. 2. Vaginal itching x2 days. Exam unremarkable. Wet prep negative. Will cover with Diflucan 150 mg x1 dose. Followup if symptoms persist, worsen or recur. 3. Osteopenia. DEXA 2011 T score -1.5. FRAX 14%/1.2%. Repeat DEXA now. Increase calcium and vitamin D reviewed. 4. Mammography 04/2014.  Continue with annual mammography. SBE monthly reviewed. 5. Pap smear 10/2011. Pap done today. No history of significant abnormal Pap smears previously. 6. Colonoscopy 2009. Recommended reported repeat interval 10 years. 7. Health maintenance. No routine blood work done and she reports is done at her primary physician's office. Follow up for bone density otherwise one year, sooner if any issues.   Note: This document was prepared with digital dictation and possible smart phrase technology. Any transcriptional errors that result from this process are unintentional.   [name], 11:56 AM 06/07/2014

## 2014-06-07 NOTE — Patient Instructions (Signed)
Take the Diflucan pill for the vaginal itching. Followup if the symptoms persist or worsen. Followup for bone density as scheduled. Followup in one year for annual exam. Followup sooner if any issues to include vaginal bleeding.  You may obtain a copy of any labs that were done today by logging onto MyChart as outlined in the instructions provided with your AVS (after visit summary). The office will not call with normal lab results but certainly if there are any significant abnormalities then we will contact you.   Health Maintenance, Female A healthy lifestyle and preventative care can promote health and wellness.  Maintain regular health, dental, and eye exams.  Eat a healthy diet. Foods like vegetables, fruits, whole grains, low-fat dairy products, and lean protein foods contain the nutrients you need without too many calories. Decrease your intake of foods high in solid fats, added sugars, and salt. Get information about a proper diet from your caregiver, if necessary.  Regular physical exercise is one of the most important things you can do for your health. Most adults should get at least 150 minutes of moderate-intensity exercise (any activity that increases your heart rate and causes you to sweat) each week. In addition, most adults need muscle-strengthening exercises on 2 or more days a week.   Maintain a healthy weight. The body mass index (BMI) is a screening tool to identify possible weight problems. It provides an estimate of body fat based on height and weight. Your caregiver can help determine your BMI, and can help you achieve or maintain a healthy weight. For adults 20 years and older:  A BMI below 18.5 is considered underweight.  A BMI of 18.5 to 24.9 is normal.  A BMI of 25 to 29.9 is considered overweight.  A BMI of 30 and above is considered obese.  Maintain normal blood lipids and cholesterol by exercising and minimizing your intake of saturated fat. Eat a balanced  diet with plenty of fruits and vegetables. Blood tests for lipids and cholesterol should begin at age 8 and be repeated every 5 years. If your lipid or cholesterol levels are high, you are over 50, or you are a high risk for heart disease, you may need your cholesterol levels checked more frequently.Ongoing high lipid and cholesterol levels should be treated with medicines if diet and exercise are not effective.  If you smoke, find out from your caregiver how to quit. If you do not use tobacco, do not start.  Lung cancer screening is recommended for adults aged 18 80 years who are at high risk for developing lung cancer because of a history of smoking. Yearly low-dose computed tomography (CT) is recommended for people who have at least a 30-pack-year history of smoking and are a current smoker or have quit within the past 15 years. A pack year of smoking is smoking an average of 1 pack of cigarettes a day for 1 year (for example: 1 pack a day for 30 years or 2 packs a day for 15 years). Yearly screening should continue until the smoker has stopped smoking for at least 15 years. Yearly screening should also be stopped for people who develop a health problem that would prevent them from having lung cancer treatment.  If you are pregnant, do not drink alcohol. If you are breastfeeding, be very cautious about drinking alcohol. If you are not pregnant and choose to drink alcohol, do not exceed 1 drink per day. One drink is considered to be 12 ounces (355 mL)  of beer, 5 ounces (148 mL) of wine, or 1.5 ounces (44 mL) of liquor.  Avoid use of street drugs. Do not share needles with anyone. Ask for help if you need support or instructions about stopping the use of drugs.  High blood pressure causes heart disease and increases the risk of stroke. Blood pressure should be checked at least every 1 to 2 years. Ongoing high blood pressure should be treated with medicines, if weight loss and exercise are not  effective.  If you are 75 to 64 years old, ask your caregiver if you should take aspirin to prevent strokes.  Diabetes screening involves taking a blood sample to check your fasting blood sugar level. This should be done once every 3 years, after age 7, if you are within normal weight and without risk factors for diabetes. Testing should be considered at a younger age or be carried out more frequently if you are overweight and have at least 1 risk factor for diabetes.  Breast cancer screening is essential preventative care for women. You should practice "breast self-awareness." This means understanding the normal appearance and feel of your breasts and may include breast self-examination. Any changes detected, no matter how small, should be reported to a caregiver. Women in their 48s and 30s should have a clinical breast exam (CBE) by a caregiver as part of a regular health exam every 1 to 3 years. After age 78, women should have a CBE every year. Starting at age 22, women should consider having a mammogram (breast X-ray) every year. Women who have a family history of breast cancer should talk to their caregiver about genetic screening. Women at a high risk of breast cancer should talk to their caregiver about having an MRI and a mammogram every year.  Breast cancer gene (BRCA)-related cancer risk assessment is recommended for women who have family members with BRCA-related cancers. BRCA-related cancers include breast, ovarian, tubal, and peritoneal cancers. Having family members with these cancers may be associated with an increased risk for harmful changes (mutations) in the breast cancer genes BRCA1 and BRCA2. Results of the assessment will determine the need for genetic counseling and BRCA1 and BRCA2 testing.  The Pap test is a screening test for cervical cancer. Women should have a Pap test starting at age 74. Between ages 23 and 28, Pap tests should be repeated every 2 years. Beginning at age 25,  you should have a Pap test every 3 years as long as the past 3 Pap tests have been normal. If you had a hysterectomy for a problem that was not cancer or a condition that could lead to cancer, then you no longer need Pap tests. If you are between ages 13 and 51, and you have had normal Pap tests going back 10 years, you no longer need Pap tests. If you have had past treatment for cervical cancer or a condition that could lead to cancer, you need Pap tests and screening for cancer for at least 20 years after your treatment. If Pap tests have been discontinued, risk factors (such as a new sexual partner) need to be reassessed to determine if screening should be resumed. Some women have medical problems that increase the chance of getting cervical cancer. In these cases, your caregiver may recommend more frequent screening and Pap tests.  The human papillomavirus (HPV) test is an additional test that may be used for cervical cancer screening. The HPV test looks for the virus that can cause the cell changes  on the cervix. The cells collected during the Pap test can be tested for HPV. The HPV test could be used to screen women aged 69 years and older, and should be used in women of any age who have unclear Pap test results. After the age of 50, women should have HPV testing at the same frequency as a Pap test.  Colorectal cancer can be detected and often prevented. Most routine colorectal cancer screening begins at the age of 9 and continues through age 18. However, your caregiver may recommend screening at an earlier age if you have risk factors for colon cancer. On a yearly basis, your caregiver may provide home test kits to check for hidden blood in the stool. Use of a small camera at the end of a tube, to directly examine the colon (sigmoidoscopy or colonoscopy), can detect the earliest forms of colorectal cancer. Talk to your caregiver about this at age 94, when routine screening begins. Direct examination of  the colon should be repeated every 5 to 10 years through age 14, unless early forms of pre-cancerous polyps or small growths are found.  Hepatitis C blood testing is recommended for all people born from [birth year] through 1966 and any individual with known risks for hepatitis C.  Practice safe sex. Use condoms and avoid high-risk sexual practices to reduce the spread of sexually transmitted infections (STIs). Sexually active women aged 68 and younger should be checked for Chlamydia, which is a common sexually transmitted infection. Older women with new or multiple partners should also be tested for Chlamydia. Testing for other STIs is recommended if you are sexually active and at increased risk.  Osteoporosis is a disease in which the bones lose minerals and strength with aging. This can result in serious bone fractures. The risk of osteoporosis can be identified using a bone density scan. Women ages 5 and over and women at risk for fractures or osteoporosis should discuss screening with their caregivers. Ask your caregiver whether you should be taking a calcium supplement or vitamin D to reduce the rate of osteoporosis.  Menopause can be associated with physical symptoms and risks. Hormone replacement therapy is available to decrease symptoms and risks. You should talk to your caregiver about whether hormone replacement therapy is right for you.  Use sunscreen. Apply sunscreen liberally and repeatedly throughout the day. You should seek shade when your shadow is shorter than you. Protect yourself by wearing long sleeves, pants, a wide-brimmed hat, and sunglasses year round, whenever you are outdoors.  Notify your caregiver of new moles or changes in moles, especially if there is a change in shape or color. Also notify your caregiver if a mole is larger than the size of a pencil eraser.  Stay current with your immunizations. Document Released: 05/21/2011 Document Revised: 03/02/2013 Document Reviewed:  05/21/2011 [facility] Patient Information 2014 [place].

## 2014-06-08 LAB — URINALYSIS W MICROSCOPIC + REFLEX CULTURE
BILIRUBIN URINE: NEGATIVE
Bacteria, UA: NONE SEEN
Casts: NONE SEEN
GLUCOSE, UA: NEGATIVE mg/dL
Hgb urine dipstick: NEGATIVE
Ketones, ur: NEGATIVE mg/dL
Leukocytes, UA: NEGATIVE
Nitrite: NEGATIVE
Protein, ur: NEGATIVE mg/dL
SPECIFIC GRAVITY, URINE: 1.017 (ref 1.005–1.030)
Squamous Epithelial / LPF: NONE SEEN
Urobilinogen, UA: 0.2 mg/dL (ref 0.0–1.0)
pH: 6.5 (ref 5.0–8.0)

## 2014-06-08 LAB — CYTOLOGY - PAP

## 2014-06-29 ENCOUNTER — Ambulatory Visit (INDEPENDENT_AMBULATORY_CARE_PROVIDER_SITE_OTHER): Payer: 59

## 2014-06-29 DIAGNOSIS — M949 Disorder of cartilage, unspecified: Secondary | ICD-10-CM

## 2014-06-29 DIAGNOSIS — M8580 Other specified disorders of bone density and structure, unspecified site: Secondary | ICD-10-CM

## 2014-06-29 DIAGNOSIS — M899 Disorder of bone, unspecified: Secondary | ICD-10-CM

## 2014-07-05 ENCOUNTER — Encounter: Payer: Self-pay | Admitting: Gynecology

## 2014-07-09 ENCOUNTER — Other Ambulatory Visit: Payer: Self-pay | Admitting: *Deleted

## 2014-07-09 DIAGNOSIS — M8580 Other specified disorders of bone density and structure, unspecified site: Secondary | ICD-10-CM

## 2014-07-12 ENCOUNTER — Other Ambulatory Visit: Payer: 59

## 2014-07-12 DIAGNOSIS — M8580 Other specified disorders of bone density and structure, unspecified site: Secondary | ICD-10-CM

## 2014-07-13 LAB — VITAMIN D 25 HYDROXY (VIT D DEFICIENCY, FRACTURES): Vit D, 25-Hydroxy: 52 ng/mL (ref 30–89)

## 2014-09-03 ENCOUNTER — Other Ambulatory Visit: Payer: Self-pay

## 2014-09-20 ENCOUNTER — Encounter: Payer: Self-pay | Admitting: Gastroenterology

## 2014-12-13 ENCOUNTER — Other Ambulatory Visit: Payer: Self-pay | Admitting: Dermatology

## 2015-01-03 ENCOUNTER — Other Ambulatory Visit: Payer: 59 | Admitting: Internal Medicine

## 2015-01-14 ENCOUNTER — Encounter: Payer: 59 | Admitting: Internal Medicine

## 2015-01-17 ENCOUNTER — Ambulatory Visit (INDEPENDENT_AMBULATORY_CARE_PROVIDER_SITE_OTHER): Payer: 59 | Admitting: Family Medicine

## 2015-01-17 ENCOUNTER — Encounter: Payer: Self-pay | Admitting: Family Medicine

## 2015-01-17 VITALS — BP 122/86 | HR 82 | Temp 97.9°F | Ht 66.5 in | Wt 182.3 lb

## 2015-01-17 DIAGNOSIS — J309 Allergic rhinitis, unspecified: Secondary | ICD-10-CM

## 2015-01-17 DIAGNOSIS — R600 Localized edema: Secondary | ICD-10-CM | POA: Insufficient documentation

## 2015-01-17 DIAGNOSIS — Z7689 Persons encountering health services in other specified circumstances: Secondary | ICD-10-CM

## 2015-01-17 DIAGNOSIS — M8580 Other specified disorders of bone density and structure, unspecified site: Secondary | ICD-10-CM

## 2015-01-17 DIAGNOSIS — Z7189 Other specified counseling: Secondary | ICD-10-CM

## 2015-01-17 DIAGNOSIS — E663 Overweight: Secondary | ICD-10-CM

## 2015-01-17 NOTE — Patient Instructions (Signed)
BEFORE YOU LEAVE: -fasting lab appointment in the next 1 month -follow up in 1 year  We recommend the following healthy lifestyle measures: - eat a healthy diet consisting of lots of vegetables, fruits, beans, nuts, seeds, healthy meats such as white chicken and fish and whole grains.  - avoid fried foods, fast food, processed foods, sodas, red meet and other fattening foods.  - get a least 150 minutes of aerobic exercise per week.

## 2015-01-17 NOTE — Progress Notes (Signed)
Pre visit review using our clinic review tool, if applicable. No additional management support is needed unless otherwise documented below in the visit note. 

## 2015-01-17 NOTE — Progress Notes (Signed)
HPI:  Mackenzie Norton is here to establish care. Used to see Dr. Arnoldo Morale. Last PCP and physical: sees Dr. Phineas Real in gyn, 05/2014  Has the following chronic problems that require follow up and concerns today:  HM: -flu vaccine - done -HIV testing - sees gyn, declined  Seasonal allergies: -takes antihistamine prn -denies: asthma, wheezing  Elevated Blood Pressure: -reports BP was 220 systolic at chiropractor visit -denies: CP, SOb, DOE swelling -no regular exercise -did start weight watchers  Osteopenia: -sees gyn  ROS negative for unless reported above: fevers, unintentional weight loss, hearing or vision loss, chest pain, palpitations, struggling to breath, hemoptysis, melena, hematochezia, hematuria, falls, loc, si, thoughts of self harm  Past Medical History  Diagnosis Date  . Allergic rhinitis   . Sarcoidosis     ? of this on skin bx with dermatologist, no symptoms, never symptomatic - sees Dr. Tonia Brooms  . Diverticulosis of colon   . Ovarian cyst     Left and Right  . Osteopenia 06/2014    T score -1.5 FRAX 14%/1.4%  . Vertebral compression fracture     L-6  . Diverticulosis of large intestine 09/17/2007    Qualifier: Diagnosis of  By: Arnoldo Morale MD, Balinda Quails Pectus excavatum     Past Surgical History  Procedure Laterality Date  . Colonoscopy  12/17/05  . Tonsillectomy    . Mole removal and skin cancer surgery at [place]    . Oophorectomy  1990    LSO  . Wrist surgery      Family History  Problem Relation Age of Onset  . Arthritis Mother   . Parkinsonism Mother   . Cancer Father     lymphoma  CLL  . Breast cancer Maternal Grandmother     Age 21  . Heart disease Maternal Grandmother   . Heart disease Maternal Grandfather   . Breast cancer Paternal Grandmother     Age 14  . Cancer Paternal Grandfather     Esophageal cancer    History   Social History  . Marital Status: Widowed    Spouse Name: N/A  . Number of Children: N/A  . Years of  Education: N/A   Social History Main Topics  . Smoking status: Former Smoker    Types: Cigarettes    Quit date: 08/19/1980  . Smokeless tobacco: Never Used  . Alcohol Use: 2.5 oz/week    5 Standard drinks or equivalent per week  . Drug Use: No  . Sexual Activity: No   Other Topics Concern  . 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  aspirin 81 MG chewable tablet, Chew 81 mg by mouth daily.  , Disp: , Rfl:  .  B Complex Vitamins (B COMPLEX 1 PO), Take by mouth daily.  , Disp: , Rfl:  .  Biotin 10 MG TABS, Take by mouth daily.  , Disp: , Rfl:  .  Calcium-Vitamin D 600-200 MG-UNIT per tablet, Take 1 tablet by mouth 2 (two) times daily.  , Disp: , Rfl:  .  Cholecalciferol (VITAMIN D3) 1000 UNITS CAPS, Take 2,000 Units by  mouth daily.  , Disp: , Rfl:  .  fish oil-omega-3 fatty acids 1000 MG capsule, Take 1 g by mouth daily.  , Disp: , Rfl:  .  Glucosamine-Chondroit-Vit C-Mn (GLUCOSAMINE CHONDR 500 COMPLEX PO), Take by mouth.  , Disp: , Rfl:  .  Magnesium (ESSENTIAL MAGNESIUM) 250 MG TABS, Take by mouth daily.  , Disp: , Rfl:  .  NON FORMULARY, Digestive enzyme, Disp: , Rfl:  .  NON FORMULARY, Aller-clear, Disp: , Rfl:  .  Nutritional Supplements (JUICE PLUS FIBRE PO), Take by mouth daily.  , Disp: , Rfl:  .  vitamin B-12 (CYANOCOBALAMIN) 250 MCG tablet, Take 250 mcg by mouth daily., Disp: , Rfl:   EXAM:  Filed Vitals:   01/17/15 0825  BP: 122/86  Pulse: 82  Temp: 97.9 F (36.6 C)    Body mass index is 28.99 kg/(m^2).  GENERAL: vitals reviewed and listed above, alert, oriented, appears well hydrated and in no acute distress  HEENT: atraumatic, conjunttiva clear, no obvious abnormalities on inspection of external nose  and ears  NECK: no obvious masses on inspection  LUNGS: clear to auscultation bilaterally, no wheezes, rales or rhonchi, good air movement  CV: HRRR, no peripheral edema  MS: moves all extremities without noticeable abnormality  PSYCH: pleasant and cooperative, no obvious depression or anxiety  ASSESSMENT AND PLAN:  Discussed the following assessment and plan:  Encounter to establish care  Overweight (BMI 25.0-29.9) - Plan: Lipid Panel, Hemoglobin A1c  Osteopenia  Allergic rhinitis, unspecified allergic rhinitis type  Edema of lower extremity, unspecified laterality  -We reviewed the PMH, PSH, FH, SH, Meds and Allergies. -We provided refills for any medications we will prescribe as needed. -We addressed current concerns per orders and patient instructions. -We have asked for records for pertinent exams, studies, vaccines and notes from previous providers. -We have advised patient to follow up per instructions below.   -Patient advised to return or notify a doctor immediately if symptoms worsen or persist or new concerns arise.  Patient Instructions  BEFORE YOU LEAVE: -fasting lab appointment in the next 1 month -follow up in 1 year  We recommend the following healthy lifestyle measures: - eat a healthy diet consisting of lots of vegetables, fruits, beans, nuts, seeds, healthy meats such as white chicken and fish and whole grains.  - avoid fried foods, fast food, processed foods, sodas, red meet and other fattening foods.  - get a least 150 minutes of aerobic exercise per week.       Colin Benton R.

## 2015-01-21 ENCOUNTER — Other Ambulatory Visit (INDEPENDENT_AMBULATORY_CARE_PROVIDER_SITE_OTHER): Payer: 59

## 2015-01-21 DIAGNOSIS — E663 Overweight: Secondary | ICD-10-CM

## 2015-01-21 LAB — LIPID PANEL
CHOL/HDL RATIO: 2
Cholesterol: 121 mg/dL (ref 0–200)
HDL: 51.1 mg/dL (ref >39.00)
LDL Cholesterol: 54 mg/dL (ref 0–99)
NONHDL: 69.9
Triglycerides: 78 mg/dL (ref 0.0–149.0)
VLDL: 15.6 mg/dL (ref 0.0–40.0)

## 2015-01-21 LAB — HEMOGLOBIN A1C: HEMOGLOBIN A1C: 5.7 % (ref 4.6–6.5)

## 2015-05-31 ENCOUNTER — Encounter: Payer: Self-pay | Admitting: Gynecology

## 2015-06-02 ENCOUNTER — Encounter: Payer: Self-pay | Admitting: Gynecology

## 2015-06-09 ENCOUNTER — Other Ambulatory Visit: Payer: Self-pay | Admitting: Radiology

## 2015-06-13 ENCOUNTER — Ambulatory Visit (INDEPENDENT_AMBULATORY_CARE_PROVIDER_SITE_OTHER): Payer: Managed Care, Other (non HMO) | Admitting: Gynecology

## 2015-06-13 ENCOUNTER — Encounter: Payer: Self-pay | Admitting: Gynecology

## 2015-06-13 VITALS — BP 118/76 | Ht 67.0 in | Wt 160.0 lb

## 2015-06-13 DIAGNOSIS — N952 Postmenopausal atrophic vaginitis: Secondary | ICD-10-CM | POA: Diagnosis not present

## 2015-06-13 DIAGNOSIS — C50911 Malignant neoplasm of unspecified site of right female breast: Secondary | ICD-10-CM | POA: Diagnosis not present

## 2015-06-13 DIAGNOSIS — Z01419 Encounter for gynecological examination (general) (routine) without abnormal findings: Secondary | ICD-10-CM

## 2015-06-13 DIAGNOSIS — M8580 Other specified disorders of bone density and structure, unspecified site: Secondary | ICD-10-CM | POA: Diagnosis not present

## 2015-06-13 NOTE — Progress Notes (Signed)
Mackenzie Norton [birth year] [id number]        63 y.o.  G0P0 for annual exam.  Several issues noted below.  Past medical history,surgical history, problem list, medications, allergies, family history and social history were all reviewed and documented as reviewed in the EPIC chart.  ROS:  Performed with pertinent positives and negatives included in the history, assessment and plan.   Additional significant findings :  none   Exam: Mackenzie Norton Vitals:   06/13/15 0922  BP: 118/76  Height: 5\' 7"  (1.702 m)  Weight: 160 lb (72.576 kg)   General appearance:  Normal affect, orientation and appearance. Skin: Grossly normal HEENT: Without gross lesions.  No cervical or supraclavicular adenopathy. Thyroid normal.  Lungs:  Clear without wheezing, rales or rhonchi Cardiac: RR, without RMG Abdominal:  Soft, nontender, without masses, guarding, rebound, organomegaly or hernia Breasts:  Examined lying and sitting without masses, retractions, discharge or axillary adenopathy.  Bruising noted 2 to 3:00 position right breast at biopsy site. No underlying palpable masses Pelvic:  Ext/BUS/vagina with atrophic changes  Cervix with atrophic changes  Uterus axial to anteverted, normal size, shape and contour, midline and mobile nontender   Adnexa  Without masses or tenderness    Anus and perineum  Normal   Rectovaginal  Normal sphincter tone without palpated masses or tenderness.    Assessment/Plan:  64 y.o. G0P0 female for annual exam.   1. Recent diagnoses with breast cancer found on screening mammography. Just told this past Friday. Biopsy results not in Epic. 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 Has appointment to see general surgeon in 2 weeks. Will follow up with him and subsequently with oncology as prescribed. 2. Osteopenia.  DEXA 06/2014 T score -1.5 FRAX 14%/1.4%. Plan repeat DEXA next year or 2 year interval. Increased calcium vitamin D reviewed. 3. Postmenopausal. Without significant hot  flushes, night sweats, vaginal dryness. No bleeding. Continue to monitor and report any vaginal bleeding. 4. Pap smear 05/2014. No Pap smear done today. No history of abnormal Pap smears previously. Plan repeat Pap smear at 3 year interval per current screening guidelines. 5. Colonoscopy 2009. Recommended repeat interval reported at 10 years. 6. Health maintenance. No routine blood work done as this is done at Dr. Julianne Rice office. Follow up 1 year, sooner as needed.   [name], 9:53 AM 06/13/2015

## 2015-06-13 NOTE — Patient Instructions (Signed)
You may obtain a copy of any labs that were done today by logging onto MyChart as outlined in the instructions provided with your AVS (after visit summary). The office will not call with normal lab results but certainly if there are any significant abnormalities then we will contact you.   Health Maintenance, Female A healthy lifestyle and preventative care can promote health and wellness.  Maintain regular health, dental, and eye exams.  Eat a healthy diet. Foods like vegetables, fruits, whole grains, low-fat dairy products, and lean protein foods contain the nutrients you need without too many calories. Decrease your intake of foods high in solid fats, added sugars, and salt. Get information about a proper diet from your caregiver, if necessary.  Regular physical exercise is one of the most important things you can do for your health. Most adults should get at least 150 minutes of moderate-intensity exercise (any activity that increases your heart rate and causes you to sweat) each week. In addition, most adults need muscle-strengthening exercises on 2 or more days a week.   Maintain a healthy weight. The body mass index (BMI) is a screening tool to identify possible weight problems. It provides an estimate of body fat based on height and weight. Your caregiver can help determine your BMI, and can help you achieve or maintain a healthy weight. For adults 20 years and older:  A BMI below 18.5 is considered underweight.  A BMI of 18.5 to 24.9 is normal.  A BMI of 25 to 29.9 is considered overweight.  A BMI of 30 and above is considered obese.  Maintain normal blood lipids and cholesterol by exercising and minimizing your intake of saturated fat. Eat a balanced diet with plenty of fruits and vegetables. Blood tests for lipids and cholesterol should begin at age 61 and be repeated every 5 years. If your lipid or cholesterol levels are high, you are over 50, or you are a high risk for heart  disease, you may need your cholesterol levels checked more frequently.Ongoing high lipid and cholesterol levels should be treated with medicines if diet and exercise are not effective.  If you smoke, find out from your caregiver how to quit. If you do not use tobacco, do not start.  Lung cancer screening is recommended for adults aged 33 80 years who are at high risk for developing lung cancer because of a history of smoking. Yearly low-dose computed tomography (CT) is recommended for people who have at least a 30-pack-year history of smoking and are a current smoker or have quit within the past 15 years. A pack year of smoking is smoking an average of 1 pack of cigarettes a day for 1 year (for example: 1 pack a day for 30 years or 2 packs a day for 15 years). Yearly screening should continue until the smoker has stopped smoking for at least 15 years. Yearly screening should also be stopped for people who develop a health problem that would prevent them from having lung cancer treatment.  If you are pregnant, do not drink alcohol. If you are breastfeeding, be very cautious about drinking alcohol. If you are not pregnant and choose to drink alcohol, do not exceed 1 drink per day. One drink is considered to be 12 ounces (355 mL) of beer, 5 ounces (148 mL) of wine, or 1.5 ounces (44 mL) of liquor.  Avoid use of street drugs. Do not share needles with anyone. Ask for help if you need support or instructions about stopping  the use of drugs.  High blood pressure causes heart disease and increases the risk of stroke. Blood pressure should be checked at least every 1 to 2 years. Ongoing high blood pressure should be treated with medicines, if weight loss and exercise are not effective.  If you are 59 to 64 years old, ask your caregiver if you should take aspirin to prevent strokes.  Diabetes screening involves taking a blood sample to check your fasting blood sugar level. This should be done once every 3  years, after age 91, if you are within normal weight and without risk factors for diabetes. Testing should be considered at a younger age or be carried out more frequently if you are overweight and have at least 1 risk factor for diabetes.  Breast cancer screening is essential preventative care for women. You should practice "breast self-awareness." This means understanding the normal appearance and feel of your breasts and may include breast self-examination. Any changes detected, no matter how small, should be reported to a caregiver. Women in their 66s and 30s should have a clinical breast exam (CBE) by a caregiver as part of a regular health exam every 1 to 3 years. After age 101, women should have a CBE every year. Starting at age 100, women should consider having a mammogram (breast X-ray) every year. Women who have a family history of breast cancer should talk to their caregiver about genetic screening. Women at a high risk of breast cancer should talk to their caregiver about having an MRI and a mammogram every year.  Breast cancer gene (BRCA)-related cancer risk assessment is recommended for women who have family members with BRCA-related cancers. BRCA-related cancers include breast, ovarian, tubal, and peritoneal cancers. Having family members with these cancers may be associated with an increased risk for harmful changes (mutations) in the breast cancer genes BRCA1 and BRCA2. Results of the assessment will determine the need for genetic counseling and BRCA1 and BRCA2 testing.  The Pap test is a screening test for cervical cancer. Women should have a Pap test starting at age 57. Between ages 25 and 35, Pap tests should be repeated every 2 years. Beginning at age 37, you should have a Pap test every 3 years as long as the past 3 Pap tests have been normal. If you had a hysterectomy for a problem that was not cancer or a condition that could lead to cancer, then you no longer need Pap tests. If you are  between ages 50 and 76, and you have had normal Pap tests going back 10 years, you no longer need Pap tests. If you have had past treatment for cervical cancer or a condition that could lead to cancer, you need Pap tests and screening for cancer for at least 20 years after your treatment. If Pap tests have been discontinued, risk factors (such as a new sexual partner) need to be reassessed to determine if screening should be resumed. Some women have medical problems that increase the chance of getting cervical cancer. In these cases, your caregiver may recommend more frequent screening and Pap tests.  The human papillomavirus (HPV) test is an additional test that may be used for cervical cancer screening. The HPV test looks for the virus that can cause the cell changes on the cervix. The cells collected during the Pap test can be tested for HPV. The HPV test could be used to screen women aged 44 years and older, and should be used in women of any age  who have unclear Pap test results. After the age of 55, women should have HPV testing at the same frequency as a Pap test.  Colorectal cancer can be detected and often prevented. Most routine colorectal cancer screening begins at the age of 44 and continues through age 20. However, your caregiver may recommend screening at an earlier age if you have risk factors for colon cancer. On a yearly basis, your caregiver may provide home test kits to check for hidden blood in the stool. Use of a small camera at the end of a tube, to directly examine the colon (sigmoidoscopy or colonoscopy), can detect the earliest forms of colorectal cancer. Talk to your caregiver about this at age 86, when routine screening begins. Direct examination of the colon should be repeated every 5 to 10 years through age 13, unless early forms of pre-cancerous polyps or small growths are found.  Hepatitis C blood testing is recommended for all people born from [birth year] through 1966 and any  individual with known risks for hepatitis C.  Practice safe sex. Use condoms and avoid high-risk sexual practices to reduce the spread of sexually transmitted infections (STIs). Sexually active women aged 36 and younger should be checked for Chlamydia, which is a common sexually transmitted infection. Older women with new or multiple partners should also be tested for Chlamydia. Testing for other STIs is recommended if you are sexually active and at increased risk.  Osteoporosis is a disease in which the bones lose minerals and strength with aging. This can result in serious bone fractures. The risk of osteoporosis can be identified using a bone density scan. Women ages 20 and over and women at risk for fractures or osteoporosis should discuss screening with their caregivers. Ask your caregiver whether you should be taking a calcium supplement or vitamin D to reduce the rate of osteoporosis.  Menopause can be associated with physical symptoms and risks. Hormone replacement therapy is available to decrease symptoms and risks. You should talk to your caregiver about whether hormone replacement therapy is right for you.  Use sunscreen. Apply sunscreen liberally and repeatedly throughout the day. You should seek shade when your shadow is shorter than you. Protect yourself by wearing long sleeves, pants, a wide-brimmed hat, and sunglasses year round, whenever you are outdoors.  Notify your caregiver of new moles or changes in moles, especially if there is a change in shape or color. Also notify your caregiver if a mole is larger than the size of a pencil eraser.  Stay current with your immunizations. Document Released: 05/21/2011 Document Revised: 03/02/2013 Document Reviewed: 05/21/2011 [facility] Patient Information 2014 [employer].

## 2015-06-15 ENCOUNTER — Encounter: Payer: Self-pay | Admitting: Gynecology

## 2015-06-15 ENCOUNTER — Telehealth: Payer: Self-pay | Admitting: *Deleted

## 2015-06-15 NOTE — Telephone Encounter (Signed)
Received referral from Elizabeth.  Called pt and confirmed 06/17/15 med onc appt.  Unable to mail before appt letter - gave verbal.  Unable to mail welcoming packet - gave directions and instructions.  Unable to mail intake form - placed a note for one to be given at time of check in.  Emailed Lars Mage and Dr. Donne Hazel at [facility] to make them aware.  Emailed Dawn requesting for her to please print Dr. Cristal Generous note when ready and place in Dr. Geralyn Flash box and send one to HIM to scan.

## 2015-06-16 ENCOUNTER — Other Ambulatory Visit: Payer: Self-pay | Admitting: General Surgery

## 2015-06-16 ENCOUNTER — Telehealth: Payer: Self-pay | Admitting: *Deleted

## 2015-06-16 DIAGNOSIS — C50411 Malignant neoplasm of upper-outer quadrant of right female breast: Secondary | ICD-10-CM | POA: Insufficient documentation

## 2015-06-16 DIAGNOSIS — C50911 Malignant neoplasm of unspecified site of right female breast: Secondary | ICD-10-CM

## 2015-06-16 NOTE — Telephone Encounter (Signed)
Per Bary Castilla - pt also needs to be seen by genetics per Dr. Donne Hazel.  Called [facility] Kays[facility] and obtained and appt.  Called pt and confirmed 06/17/15 genetic appt w/ her.  Emailed Alisha at [employer] requesting for her to please fax over the genetic referral.

## 2015-06-17 ENCOUNTER — Encounter: Payer: Self-pay | Admitting: Hematology and Oncology

## 2015-06-17 ENCOUNTER — Encounter: Payer: Self-pay | Admitting: Genetic Counselor

## 2015-06-17 ENCOUNTER — Ambulatory Visit (HOSPITAL_BASED_OUTPATIENT_CLINIC_OR_DEPARTMENT_OTHER): Payer: Commercial Indemnity | Admitting: Hematology and Oncology

## 2015-06-17 ENCOUNTER — Ambulatory Visit: Payer: Commercial Indemnity

## 2015-06-17 ENCOUNTER — Ambulatory Visit: Payer: Commercial Indemnity | Admitting: Genetic Counselor

## 2015-06-17 ENCOUNTER — Other Ambulatory Visit: Payer: Commercial Indemnity

## 2015-06-17 VITALS — BP 143/75 | HR 70 | Temp 98.2°F | Resp 18 | Ht 67.0 in | Wt 161.7 lb

## 2015-06-17 DIAGNOSIS — C50411 Malignant neoplasm of upper-outer quadrant of right female breast: Secondary | ICD-10-CM | POA: Diagnosis not present

## 2015-06-17 DIAGNOSIS — C50911 Malignant neoplasm of unspecified site of right female breast: Secondary | ICD-10-CM

## 2015-06-17 DIAGNOSIS — Z803 Family history of malignant neoplasm of breast: Secondary | ICD-10-CM

## 2015-06-17 NOTE — Progress Notes (Signed)
[facility] CONSULT NOTE  Patient Care Team: Lucretia Kern, DO as PCP - General (Family Medicine)  CHIEF COMPLAINTS/PURPOSE OF CONSULTATION:  Newly diagnosed breast cancer  HISTORY OF PRESENTING ILLNESS:  Mackenzie Norton 64 y.o. female is here because of recent diagnosis of right breast cancer. 20 years ago she had a palpable abnormality that was excised and was felt to be benign. She had a recent mammogram that showed abnormality in the right breast which led to an ultrasound and a biopsy. 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 She had seen Dr. Donne Hazel and is here today for consultation regarding options. She also saw genetics counselor today for genetic counseling.  I reviewed her records extensively and collaborated the history with the patient.  SUMMARY OF ONCOLOGIC HISTORY:   Breast cancer of upper-outer quadrant of right female breast   05/30/2015 Mammogram Mammogram revealed 1.5 cm mass in the right upper quadrant, ultrasound: Right axillary tail 1.2 cm irregular solid mass   06/09/2015 Initial Diagnosis Right breast biopsy: Invasive ductal carcinoma with papillary features, DCIS, ER 0%, PR 0%, Ki-67 70%, HER-2 negative ratio 1.67    MEDICAL HISTORY:  Past Medical History  Diagnosis Date  . Allergic rhinitis   . Sarcoidosis     ? of this on skin bx with dermatologist, no symptoms, never symptomatic - sees Dr. Tonia Brooms  . Diverticulosis of colon   . Ovarian cyst     Left and Right  . Osteopenia 06/2014    T score -1.5 FRAX 14%/1.4%  . Vertebral compression fracture     L-6  . Diverticulosis of large intestine 09/17/2007    Qualifier: Diagnosis of  By: Arnoldo Morale MD, Balinda Quails   . Pectus excavatum   . Cancer     Breast cancer  . Family history of breast cancer     SURGICAL HISTORY: Past Surgical History  Procedure Laterality Date  . Colonoscopy  12/17/05  . Tonsillectomy    . Mole removal and  skin cancer surgery at [facility]      Mohs surg  . Oophorectomy  1990    LSO  . Wrist surgery    . Breast surgery      Biopsy    SOCIAL HISTORY: History   Social History  . Marital Status: Widowed    Spouse Name: N/A  . Number of Children: N/A  . Years of Education: N/A   Occupational History  . Not on file.   Social History Main Topics  . Smoking status: Former Smoker -- 1.00 packs/day for 14 years    Types: Cigarettes    Quit date: 08/19/1980  . Smokeless tobacco: Never Used  . Alcohol Use: 1.2 oz/week    2 Standard drinks or equivalent per week  . Drug Use: No  . Sexual Activity: No     Comment: 1st intercourse 64 yo-Fewer than 5 partners   Other Topics Concern  . 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 Arthritis Mother   . Parkinsonism Mother   .  Leukemia Father 62    CLL  . Breast cancer Maternal Grandmother     Age 5  . Heart disease Maternal Grandmother   . Heart disease Maternal Grandfather   . Breast cancer Paternal Grandmother     Age 69  . Cancer Paternal Grandfather     Esophageal cancer  . Breast cancer Paternal Aunt 35  . Thyroid cancer Paternal Aunt 60  . Heart disease Paternal Uncle   . Multiple sclerosis Maternal Uncle   . Lung cancer Paternal Aunt     smoker    ALLERGIES:  is allergic to penicillins.  MEDICATIONS:  Current Outpatient Prescriptions  Medication Sig Dispense Refill  . B Complex Vitamins (B COMPLEX 1 PO) Take by mouth daily.      . Biotin 10 MG TABS Take by mouth daily.      . Calcium-Vitamin D 600-200 MG-UNIT per tablet Take 1 tablet by mouth 2 (two) times daily.      . Cholecalciferol (VITAMIN D3) 1000 UNITS CAPS Take 2,000  Units by mouth daily.      Marland Kitchen CRANBERRY EXTRACT PO Take 300 mg by mouth.    . fexofenadine (ALLEGRA) 30 MG tablet Take 30 mg by mouth 2 (two) times daily.    . fish oil-omega-3 fatty acids 1000 MG capsule Take 1 g by mouth daily.      . Glucosamine-Chondroit-Vit C-Mn (GLUCOSAMINE CHONDR 500 COMPLEX PO) Take by mouth.      . Magnesium (ESSENTIAL MAGNESIUM) 250 MG TABS Take by mouth daily.      . NON FORMULARY Digestive enzyme    . Nutritional Supplements (JUICE PLUS FIBRE PO) Take by mouth daily.      . vitamin B-12 (CYANOCOBALAMIN) 250 MCG tablet Take 250 mcg by mouth daily.     No current facility-administered medications for this visit.    REVIEW OF SYSTEMS:   Constitutional: Denies fevers, chills or abnormal night sweats Eyes: Denies blurriness of vision, double vision or watery eyes Ears, nose, mouth, throat, and face: Denies mucositis or sore throat Respiratory: Denies cough, dyspnea or wheezes Cardiovascular: Denies palpitation, chest discomfort or lower extremity swelling Gastrointestinal:  Denies nausea, heartburn or change in bowel habits Skin: Denies abnormal skin rashes Lymphatics: Denies new lymphadenopathy or easy bruising Neurological:Denies numbness, tingling or new weaknesses Behavioral/Psych: Mood is stable, no new changes  Breast:  Denies any palpable lumps or discharge All other systems were reviewed with the patient and are negative.  PHYSICAL EXAMINATION: ECOG PERFORMANCE STATUS: 0 - Asymptomatic  Filed Vitals:   06/17/15 1251  BP: 143/75  Pulse: 70  Temp: 98.2 F (36.8 C)  Resp: 18   Filed Weights   06/17/15 1251  Weight: 161 lb 11.2 oz (73.347 kg)    GENERAL:alert, no distress and comfortable SKIN: skin color, texture, turgor are normal, no rashes or significant lesions EYES: normal, conjunctiva are pink and non-injected, sclera clear OROPHARYNX:no exudate, no erythema and lips, buccal mucosa, and tongue normal  NECK: supple, thyroid normal size,  non-tender, without nodularity LYMPH:  no palpable lymphadenopathy in the cervical, axillary or inguinal LUNGS: clear to auscultation and percussion with normal breathing effort HEART: regular rate & rhythm and no murmurs and no lower extremity edema ABDOMEN:abdomen soft, non-tender and normal bowel sounds Musculoskeletal:no cyanosis of digits and no clubbing  PSYCH: alert & oriented x 3 with fluent speech NEURO: no focal motor/sensory deficits BREAST: No palpable nodules in breast. No palpable axillary or supraclavicular lymphadenopathy (exam performed in the presence  of a chaperone)   LABORATORY DATA:  I have reviewed the data as listed Lab Results  Component Value Date   WBC 4.2* 01/04/2014   HGB 12.1 01/04/2014   HCT 38.3 01/04/2014   MCV 82.5 01/04/2014   PLT 200.0 01/04/2014   Lab Results  Component Value Date   NA 140 01/04/2014   K 3.8 01/04/2014   CL 107 01/04/2014   CO2 26 01/04/2014    ASSESSMENT AND PLAN:  Breast cancer of upper-outer quadrant of right female breast Right breast biopsy 06/09/2015: Invasive ductal carcinoma with papillary features, DCIS, ER 0%, PR 0%, Ki-67 70%, HER-2 negative ratio 1.67; 1.5 cm by mammogram and 1.2 cm by ultrasound, T1 cN0 stage I a clinical stage  Pathology and radiology review: I discussed the primary pathology report from the biopsy and discussed the difference between invasive ductal carcinoma and DCIS. We also discussed the significance of ER/PR receptors and HER-2/neu receptors. Being triple negative, there is no role for antiestrogen therapies.  Recommendation: 1. I discussed the risks and benefits of adjuvant versus neo-adjuvant chemotherapy. 2. I recommended neo-adjuvant chemotherapy dose dense Adriamycin and Cytoxan 4 followed by Abraxane weekly 12. I also discussed the data for adding carboplatin to Abraxane. We discussed the importance of pathologic complete response as very important surrogate for survival for triple  negative breast cancer. 3. Patient will be seeing our genetics counselor today.  Chemotherapy counseling:I have discussed the risks and benefits of chemotherapy including the risks of nausea/ vomiting, risk of infection from low WBC count, fatigue due to chemo or anemia, bruising or bleeding due to low platelets, mouth sores, loss/ change in taste and decreased appetite. Liver and kidney function will be monitored through out chemotherapy as abnormalities in liver and kidney function may be a side effect of treatment. Cardiac dysfunction due to Adriamycin was discussed in detail. Risk of permanent bone marrow dysfunction and leukemia due to chemo were also discussed.  Plan:  1. Echocardiogram 2. Port placement 3. Chemotherapy class 4. Start chemotherapy in 2 weeks  Patient has family in [place] and has more support. She has a consultation with an oncologist in [place] and might get this treatment over there. I instructed her to keep [place] posted about her decision for treatment. In the meantime we will order a breast MRI and echocardiogram. If she would like to get these done in [place] then she can cancel them.  All questions were answered. The patient knows to call the clinic with any problems, questions or concerns.    Rulon Eisenmenger, MD 1:41 PM

## 2015-06-17 NOTE — Assessment & Plan Note (Signed)
Right breast biopsy 06/09/2015: Invasive ductal carcinoma with papillary features, DCIS, ER 0%, PR 0%, Ki-67 70%, HER-2 negative ratio 1.67; 1.5 cm by mammogram and 1.2 cm by ultrasound, T1 cN0 stage I a clinical stage  Pathology and radiology review: I discussed the primary pathology report from the biopsy and discussed the difference between invasive ductal carcinoma and DCIS. We also discussed the significance of ER/PR receptors and HER-2/neu receptors. Being triple negative, there is no role for antiestrogen therapies.  Recommendation: 1. I discussed the risks and benefits of adjuvant versus neo-adjuvant chemotherapy. 2. I recommended neo-adjuvant chemotherapy dose dense Adriamycin and Cytoxan 4 followed by Abraxane weekly 12. I also discussed the data for adding carboplatin to Abraxane. We discussed the importance of pathologic complete response as very important surrogate for survival for triple negative breast cancer. 3. Patient will be seeing our genetics counselor today.  Chemotherapy counseling:I have discussed the risks and benefits of chemotherapy including the risks of nausea/ vomiting, risk of infection from low WBC count, fatigue due to chemo or anemia, bruising or bleeding due to low platelets, mouth sores, loss/ change in taste and decreased appetite. Liver and kidney function will be monitored through out chemotherapy as abnormalities in liver and kidney function may be a side effect of treatment. Cardiac dysfunction due to Adriamycin was discussed in detail. Risk of permanent bone marrow dysfunction and leukemia due to chemo were also discussed.  Plan:  1. Echocardiogram 2. Port placement 3. Chemotherapy class 4. Start chemotherapy in 2 weeks

## 2015-06-17 NOTE — Addendum Note (Signed)
Addended by: Prentiss Bells on: 06/17/2015 02:49 PM   Modules accepted: Orders

## 2015-06-17 NOTE — Progress Notes (Signed)
REFERRING PROVIDER: Lucretia Kern, DO [place], [place] [address]   Nicholas Lose, MD  Rolm Bookbinder, MD  PRIMARY PROVIDER:  Lucretia Kern., DO  PRIMARY REASON FOR VISIT:  1. Breast cancer, right   2. Family history of breast cancer      HISTORY OF PRESENT ILLNESS:   Mackenzie Norton, a 64 y.o. female, was seen for a [place] cancer genetics consultation at the request of Dr. Donne Hazel due to a personal and family history of breast cancer.  Mackenzie Norton presents to clinic today to discuss the possibility of a hereditary predisposition to cancer, genetic testing, and to further clarify her future cancer risks, as well as potential cancer risks for family members.   In 2016, at the age of 12, Mackenzie Norton was diagnosed with invasive ductal carcinoma of the right breast.  The tumor is ER-/PR-, Her2 is not back yet.  Ki-67 is also not back. This will be treated with chemotherapy first.  Surgery will be dependant on genetics.    CANCER HISTORY:    Breast cancer of upper-outer quadrant of right female breast   05/30/2015 Mammogram Mammogram revealed 1.5 cm mass in the right upper quadrant, ultrasound: Right axillary tail 1.2 cm irregular solid mass   06/09/2015 Initial Diagnosis Right breast biopsy: Invasive ductal carcinoma with papillary features, DCIS, ER 0%, PR 0%, Ki-67 70%, HER-2 negative ratio 1.67     HORMONAL RISK FACTORS:  Menarche was at age 44.  First live birth at age N/A.  OCP use for approximately 0 years.  Ovaries intact: one ovary intact, the other ahd a cyst and was removed.  Hysterectomy: no.  Menopausal status: postmenopausal.  HRT use: 0 years. Colonoscopy: yes; normal. Mammogram within the last year: yes. Number of breast biopsies: 1. Up to date with pelvic exams:  yes. Any excessive radiation exposure in the past:  no  Past Medical History  Diagnosis Date  . Allergic rhinitis   . Sarcoidosis     ? of this on skin bx with  dermatologist, no symptoms, never symptomatic - sees Dr. Tonia Brooms  . Diverticulosis of colon   . Ovarian cyst     Left and Right  . Osteopenia 06/2014    T score -1.5 FRAX 14%/1.4%  . Vertebral compression fracture     L-6  . Diverticulosis of large intestine 09/17/2007    Qualifier: Diagnosis of  By: Arnoldo Morale MD, Balinda Quails   . Pectus excavatum   . Cancer     Breast cancer  . Family history of breast cancer     Past Surgical History  Procedure Laterality Date  . Colonoscopy  12/17/05  . Tonsillectomy    . Mole removal and skin cancer surgery at [facility]      Mohs surg  . Oophorectomy  1990    LSO  . Wrist surgery    . Breast surgery      Biopsy    History   Social History  . Marital Status: Widowed    Spouse Name: N/A  . Number of Children: N/A  . Years of Education: N/A   Social History Main Topics  . Smoking status: Former Smoker -- 1.00 packs/day for 14 years    Types: Cigarettes    Quit date: 08/19/1980  . Smokeless tobacco: Never Used  . Alcohol Use: 1.2 oz/week    2 Standard drinks or equivalent per week  . Drug Use: No  . Sexual Activity: No  Comment: 1st intercourse 64 yo-Fewer than 5 partners   Other Topics Concern  . 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  Significant diagnoses are listed below: Family History  Problem Relation Age of Onset  . Arthritis Mother   . Parkinsonism Mother   . Leukemia Father 38    CLL  . Breast cancer Maternal Grandmother     Age 44  . Heart disease Maternal Grandmother   . Heart disease Maternal Grandfather   . Breast cancer Paternal Grandmother     Age 22  . Cancer Paternal Grandfather     Esophageal cancer  . Breast cancer  Paternal Aunt 79  . Thyroid cancer Paternal Aunt 36  . Heart disease Paternal Uncle   . Multiple sclerosis Maternal Uncle   . Lung cancer Paternal Aunt     smoker   The patient does not have children.  She is the oldest of four children, and has two sisters and one brother.  All are healthy and cancer free.  Her mother died at 87 from complications of parkinsons disease.  She had a TAH-BSO at 41. Her father was diagnosed with CLL at 67 and died at 11.  He had two sisters and a brother.  One sister died of lung cancer, the other was diagnosed with thyroid cancer at 56, developed breast cancer at 58, and then went on to get lung and bone cancer and died at 2.  His brother died of a heart attack.  The patient's paternal grandmother died at 75 with breast cancer.  Her paternal grandfather died of a head/neck cancer.  Mackenzie Norton's mother had two brothers.  One died in WWII and the other died of MS at 58.  Her maternal grandmother was diagnosed with breast cancer in her early to mid 42s and her paternal grandfather died of a heart attack at 52.  He had a niece who had a cancer NOS and a nephew with lung cancer.  Patient's maternal ancestors are of [place] descent, and paternal ancestors are of [place] descent. There is no reported Ashkenazi Jewish ancestry. There is no known consanguinity.  GENETIC COUNSELING ASSESSMENT: Mackenzie Norton is a 64 y.o. female with a personal and family history of breast cancer which somewhat suggestive of a hereditary cancer syndrome and predisposition to cancer. We, therefore, discussed and recommended the following at today's visit.   DISCUSSION: Based on Mackenzie Norton's paternal family history, we discussed hereditary breast cancer syndromes including BRCA mutations and PTEN mutations (thyroid cancer and breast cancer).  We reviewed the characteristics, features and inheritance patterns of hereditary cancer syndromes. We also discussed genetic testing, including  the appropriate family members to test, the process of testing, insurance coverage and turn-around-time for results. We discussed the implications of a negative, positive and/or variant of uncertain significant result. We recommended Mackenzie Norton pursue genetic testing for the Breast/Ovarian cancer gene panel. The Breast/Ovarian gene panel offered by [facility] includes sequencing and rearrangement analysis for the following 21 genes:  ATM, BARD1, BRCA1, BRCA2, BRIP1, CDH1, CHEK2, EPCAM, FANCC, MLH1, MSH2, MSH6, NBN, PALB2, PMS2, PTEN,  RAD51C, RAD51D, STK11, TP53, and XRCC2.     Based on Mackenzie Norton's personal and family history of cancer, she meets medical criteria for genetic testing. Despite that she meets criteria, she may still have an out of pocket cost. We discussed that if her out of pocket cost for testing is over $100, the laboratory will call and confirm whether she wants to proceed with testing.  If the out of pocket cost of testing is less than $100 she will be billed by the genetic testing laboratory.   PLAN: After considering the risks, benefits, and limitations, Mackenzie Norton  provided informed consent to pursue genetic testing and the blood sample was sent to [facility] for analysis of the Breast/Ovarian cancer gene panel. Results should be available within approximately 2-3 weeks' time, at which point they will be disclosed by telephone to Mackenzie Norton, as will any additional recommendations warranted by these results. Mackenzie Norton will receive a summary of her genetic counseling visit and a copy of her results once available. This information will also be available in Epic. We encouraged Mackenzie Norton to remain in contact with cancer genetics annually so that we can continuously update the family history and inform her of any changes in cancer genetics and testing that may be of benefit for her family. Mackenzie Norton's questions were answered to her satisfaction today. Our contact  information was provided should additional questions or concerns arise.  Lastly, we encouraged Mackenzie Norton to remain in contact with cancer genetics annually so that we can continuously update the family history and inform her of any changes in cancer genetics and testing that may be of benefit for this family.   Ms.  Norton's questions were answered to her satisfaction today. Our contact information was provided should additional questions or concerns arise. Thank you for the referral and allowing [place] to share in the care of your patient.   Norton Collier P. Florene Glen, [place], [facility] Certified Genetic Counselor [email or web address] phone: [phone]  The patient was seen for a total of 60 minutes in face-to-face genetic counseling.  This patient was discussed with Drs. Magrinat, Lindi Adie and/or Burr Medico who agrees with the above.    _______________________________________________________________________ For Office Staff:  Number of people involved in session: 2 Was an Intern/ student involved with case: no

## 2015-06-22 ENCOUNTER — Telehealth: Payer: Self-pay | Admitting: *Deleted

## 2015-06-22 ENCOUNTER — Telehealth: Payer: Self-pay

## 2015-06-22 NOTE — Telephone Encounter (Signed)
Pt called to relate she will be receiving her treatment in [place]. She has the best support during treatment in [place]. Encourage pt to call with needs. Notified physician team of pt decision.

## 2015-06-22 NOTE — Telephone Encounter (Signed)
MRI order faxed to [facility].  Sent to scan.

## 2015-06-24 ENCOUNTER — Other Ambulatory Visit: Payer: Self-pay | Admitting: Cardiovascular Disease

## 2015-06-24 ENCOUNTER — Encounter (HOSPITAL_COMMUNITY): Payer: Self-pay

## 2015-06-24 ENCOUNTER — Inpatient Hospital Stay (HOSPITAL_COMMUNITY): Admission: RE | Admit: 2015-06-24 | Payer: Commercial Indemnity | Source: Ambulatory Visit

## 2015-06-30 ENCOUNTER — Telehealth: Payer: Self-pay | Admitting: Genetic Counselor

## 2015-06-30 DIAGNOSIS — Z1379 Encounter for other screening for genetic and chromosomal anomalies: Secondary | ICD-10-CM

## 2015-06-30 NOTE — Telephone Encounter (Signed)
LM on VM that results were back and to please call back. 

## 2015-06-30 NOTE — Telephone Encounter (Signed)
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  Explained that this does not typically affect breast cancer treatment but will affect how we screen her for colon and other cancers.  Scheduled a return appointment for 8/17 at 11 AM.

## 2015-07-06 ENCOUNTER — Ambulatory Visit (HOSPITAL_BASED_OUTPATIENT_CLINIC_OR_DEPARTMENT_OTHER): Payer: Commercial Indemnity | Admitting: Genetic Counselor

## 2015-07-06 ENCOUNTER — Encounter: Payer: Self-pay | Admitting: Genetic Counselor

## 2015-07-06 DIAGNOSIS — Z801 Family history of malignant neoplasm of trachea, bronchus and lung: Secondary | ICD-10-CM | POA: Diagnosis not present

## 2015-07-06 DIAGNOSIS — Z1509 Genetic susceptibility to other malignant neoplasm: Secondary | ICD-10-CM | POA: Diagnosis not present

## 2015-07-06 DIAGNOSIS — Z803 Family history of malignant neoplasm of breast: Secondary | ICD-10-CM | POA: Diagnosis not present

## 2015-07-06 DIAGNOSIS — Z808 Family history of malignant neoplasm of other organs or systems: Secondary | ICD-10-CM | POA: Diagnosis not present

## 2015-07-06 DIAGNOSIS — Z1379 Encounter for other screening for genetic and chromosomal anomalies: Secondary | ICD-10-CM

## 2015-07-06 DIAGNOSIS — Z315 Encounter for genetic counseling: Secondary | ICD-10-CM

## 2015-07-06 NOTE — Progress Notes (Signed)
HPI: Mackenzie Norton was previously seen in the [place] clinic due to a family of cancer and concerns regarding a hereditary predisposition to cancer. Please refer to our prior cancer genetics clinic note for more information regarding Mackenzie Norton's medical, social and family histories, and our assessment and recommendations, at the time. Mackenzie Norton's recent genetic test results were disclosed to Mackenzie Norton, as were recommendations warranted by these results. These results and recommendations are discussed in more detail below.   FAMILY HISTORY:  We obtained a detailed, 4-generation family history.  Significant diagnoses are listed below: Family History  Problem Relation Age of Onset  . Arthritis Mother   . Parkinsonism Mother   . Leukemia Father 65    CLL  . Breast cancer Maternal Grandmother     Age 40  . Heart disease Maternal Grandmother   . Heart disease Maternal Grandfather   . Breast cancer Paternal Grandmother     Age 56  . Cancer Paternal Grandfather     Esophageal cancer  . Breast cancer Paternal Aunt 9  . Thyroid cancer Paternal Aunt 71  . Heart disease Paternal Uncle   . Multiple sclerosis Maternal Uncle   . Lung cancer Paternal Aunt     smoker    GENETIC TEST RESULTS: At the time of Mackenzie Norton's visit, we recommended she pursue genetic testing of the Breast/Ovarian cancer gene panel. The Breast/Ovarian gene panel offered by [facility] includes sequencing and rearrangement analysis for the following 20 genes:  ATM, BARD1, BRCA1, BRCA2, BRIP1, CDH1, CHEK2, EPCAM, FANCC, MLH1, MSH2, MSH6, NBN, PALB2, PMS2, PTEN, RAD51C, RAD51D, TP53, and XRCC2.   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 A copy of the test report has been scanned into the Molecular Pathology section of the Results Review tab.   SCREENING RECOMMENDATIONS: We discussed the implications of Lynch syndrome for  Mackenzie Norton, and discussed who else in the family should have genetic testing. We recommended Mackenzie Norton follow management guidelines for Lynch syndrome; all of which are outlined below. These can be coordinated by Mackenzie Norton GI doctor or Mackenzie Norton primary provider.   1. Annual colonoscopy.   2. While there is no clear evidence to support screening for stomach and small bowel cancer, an upper endoscopy can be considered at 3-5 year intervals beginning at age 26-35. However, whether to have this screening is best determined by the gastroenterologist.   3. Annual urinalysis.   For women with Lynch syndrome, unlike the effective surveillance plan for colorectal cancer risk, there is no professional agreement regarding management for the increased risk of uterine and ovarian cancer. However, we are available to help women and their providers establish an individualized surveillance plan. It is also important for women to understand the following:   1. Women should seek medical attention if they experience abnormal vaginal bleeding.  2. Some providers may still recommend vaginal ultrasounds, uterine biopsies (for uterine cancer risk) and/or CA-125 analysis ( for ovarian cancer risk), even though these have not been shown to be effective.  3. A hysterectomy with removal of the ovaries and fallopian tubes should be considered once childbearing is completed (if planned).  FAMILY MEMBERS: Since we now know the mutation in Mackenzie Norton, we can test at-risk relatives to determine whether or not they have inherited the mutation and are at increased risk for cancer. Mackenzie Norton siblings are at 50% risk for having inherited this same mutation.  We will be  happy to meet with any of the family members or refer them to a genetic counselor in their local area. To locate genetic counselors in other cities, individuals can visit the website of the [employer] (ArtistMovie.se) and Secretary/administrator for a  Social worker by zip code.   We strongly encouraged [name] to remain in contact with [place] in cancer genetics on an annual basis so we can update [name]'s personal and family histories, and inform Mackenzie Norton of advances in cancer genetics that may be of benefit for the entire family. [name] knows she is also welcome to call with any questions or concerns, at any time.   Roma Kayser, [place], [place] [phone]

## 2015-07-08 ENCOUNTER — Encounter: Payer: Self-pay | Admitting: Gynecology

## 2015-07-21 ENCOUNTER — Encounter (HOSPITAL_COMMUNITY): Payer: Self-pay

## 2015-11-20 HISTORY — PX: ROBOTIC ASSISTED TOTAL HYSTERECTOMY: SHX6085

## 2015-12-12 ENCOUNTER — Encounter: Payer: Self-pay | Admitting: Gastroenterology

## 2016-01-09 ENCOUNTER — Telehealth: Payer: Self-pay | Admitting: Gynecology

## 2016-01-09 NOTE — Telephone Encounter (Signed)
Pt was informed with the below and is she does know to follow up.

## 2016-01-09 NOTE — Telephone Encounter (Signed)
Tell patient I received information from the genetic counselor and represents to the Lynch syndrome. My understanding from their note is that she is going to see Dr Nancy Marus in reference to discussing hysterectomy. Important that she does so.

## 2016-01-20 ENCOUNTER — Ambulatory Visit (INDEPENDENT_AMBULATORY_CARE_PROVIDER_SITE_OTHER): Payer: Managed Care, Other (non HMO) | Admitting: Family Medicine

## 2016-01-20 ENCOUNTER — Encounter: Payer: Self-pay | Admitting: Family Medicine

## 2016-01-20 VITALS — BP 100/80 | HR 80 | Temp 98.6°F | Ht 67.5 in | Wt 166.7 lb

## 2016-01-20 DIAGNOSIS — Z1509 Genetic susceptibility to other malignant neoplasm: Secondary | ICD-10-CM | POA: Diagnosis not present

## 2016-01-20 DIAGNOSIS — Z0000 Encounter for general adult medical examination without abnormal findings: Secondary | ICD-10-CM

## 2016-01-20 DIAGNOSIS — C50411 Malignant neoplasm of upper-outer quadrant of right female breast: Secondary | ICD-10-CM

## 2016-01-20 DIAGNOSIS — Z23 Encounter for immunization: Secondary | ICD-10-CM

## 2016-01-20 LAB — LIPID PANEL
CHOLESTEROL: 148 mg/dL (ref 0–200)
HDL: 67.8 mg/dL (ref >39.00)
LDL CALC: 65 mg/dL (ref 0–99)
NonHDL: 79.77
TRIGLYCERIDES: 75 mg/dL (ref 0.0–149.0)
Total CHOL/HDL Ratio: 2
VLDL: 15 mg/dL (ref 0.0–40.0)

## 2016-01-20 LAB — HEMOGLOBIN A1C: Hgb A1c MFr Bld: 5.1 % (ref 4.6–6.5)

## 2016-01-20 NOTE — Progress Notes (Signed)
HPI:  Here for CPE:  -Concerns and/or follow up today: none  Recent Dx Breast Ca: -Reports she is doing remarkably well, feels great, continues to work -Reports: He is status post chemotherapy and surgery, with radiation planning and -She is seeing Dr. Laveda Norman, [place]Had labs 2 weeks ago with oncology, CBC, CMP  Lynch Syndrome/PMS2 gene mutation -oncology/genetics advised yearly colonoscopy, , ? EGD q3-5 years, annual urinalysis, possible more intensive gyn exams, possible hysterectomy with oophorectomy -She reports she is seeing a gastroenterologist and a gynecologist in [place] -She is planning a colonoscopy, EGD, hysterectomy and oophorectomy with her specialists -She sees a dermatologist yearly for skin exam  -Diet: variety of foods, balance and well rounded   -Exercise:no  regular exercise  -Taking folic acid, vitamin D or calcium: no  -Diabetes and Dyslipidemia Screening:Fasting for cholesterol and diabetes screening today  -Hx of HTN: no  -Vaccines: wants Prevnar 13 today, has completed chemotherapy blood counts were normal 2 weeks ago  -pap history: Seeing GYN in [place], planning hysterectomy and oophorectomy  -FDLMP: n/a  -wants STI testing (Hep C if born [birth year]): no to STI testing, agreed to Hep C  -FH breast, colon or ovarian ca: see FH Last mammogram: see above Last colon cancer screening: see records, planning yearly with GI given lynch syndrome  DEXA (>/= 65): seeing gyn  -Alcohol, Tobacco, drug use: see social history  Review of Systems - no fevers, unintentional weight loss, vision loss, hearing loss, chest pain, sob, hemoptysis, melena, hematochezia, hematuria, genital discharge, changing or concerning skin lesions, bleeding, bruising, loc, thoughts of self harm or SI  Past Medical History  Diagnosis Date  . Allergic rhinitis   . Sarcoidosis ([place])     ? of this on skin bx with  dermatologist, no symptoms, never symptomatic - sees Dr. Tonia Brooms  . Diverticulosis of colon   . Ovarian cyst     Left and Right  . Osteopenia 06/2014    T score -1.5 FRAX 14%/1.4%  . Vertebral compression fracture ([facility])     L-6  . Diverticulosis of large intestine 09/17/2007    Qualifier: Diagnosis of  By: Arnoldo Morale MD, Balinda Quails   . Pectus excavatum   . Cancer [facility])     Breast cancer  . Family history of breast cancer   . Lynch syndrome     annaul colonoscopy, GI survailence, annual urinalysis and gyn management advised    Past Surgical History  Procedure Laterality Date  . Colonoscopy  12/17/05  . Tonsillectomy    . Mole removal and skin cancer surgery at [facility]      Mohs surg  . Oophorectomy  1990    LSO  . Wrist surgery    . Breast surgery      Biopsy    Family History  Problem Relation Age of Onset  . Arthritis Mother   . Parkinsonism Mother   . Leukemia Father 29    CLL  . Breast cancer Maternal Grandmother     Age 79  . Heart disease Maternal Grandmother   . Heart disease Maternal Grandfather   . Breast cancer Paternal Grandmother     Age 20  . Cancer Paternal Grandfather     Esophageal cancer  . Breast cancer Paternal Aunt 20  . Thyroid cancer Paternal Aunt 23  . Heart disease Paternal Uncle   . Multiple sclerosis Maternal Uncle   . Lung cancer Paternal Aunt  smoker    Social History   Social History  . Marital Status: Widowed    Spouse Name: N/A  . Number of Children: N/A  . Years of Education: N/A   Social History Main Topics  . Smoking status: Former Smoker -- 1.00 packs/day for 14 years    Types: Cigarettes    Quit date: 08/19/1980  . Smokeless tobacco: Never Used  . Alcohol Use: 1.2 oz/week    2 Standard drinks or equivalent per week  . Drug Use: No  . Sexual Activity: No     Comment: 1st intercourse 65 yo-Fewer than 5 partners   Other Topics Concern  . 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  Biotin 10 MG TABS, Take by mouth daily.  , Disp: , Rfl:  .  Calcium-Vitamin D 600-200 MG-UNIT per tablet, Take 1 tablet by mouth 2 (two) times daily.  , Disp: , Rfl:  .  Cholecalciferol (VITAMIN D3) 1000 UNITS CAPS, Take 2,000 Units by mouth daily.  , Disp: , Rfl:  .  CRANBERRY EXTRACT PO, Take 300 mg by mouth., Disp: , Rfl:  .  Docusate Calcium (STOOL SOFTENER PO), Take by mouth., Disp: , Rfl:  .  fexofenadine (ALLEGRA) 30 MG tablet, Take 30 mg by mouth as needed. , Disp: , Rfl:  .  fish oil-omega-3 fatty acids 1000 MG capsule, Take 1 g by mouth daily.  , Disp: , Rfl:  .  Glucosamine-Chondroit-Vit C-Mn (GLUCOSAMINE CHONDR 500 COMPLEX PO), Take by mouth.  , Disp: , Rfl:  .  Magnesium (ESSENTIAL MAGNESIUM) 250 MG TABS, Take by mouth daily.  , Disp: , Rfl:  .  NON FORMULARY, Digestive enzyme, Disp: , Rfl:  .  Nutritional Supplements (JUICE PLUS FIBRE PO), Take by mouth daily.  , Disp: , Rfl:  .  Pyridoxine HCl (VITAMIN B6 PO), Take by mouth., Disp: , Rfl:  .  vitamin B-12 (CYANOCOBALAMIN) 250 MCG tablet, Take 250 mcg by mouth daily., Disp: , Rfl:   EXAM:  Filed Vitals:   01/20/16 0820  BP: 100/80  Pulse: 80  Temp: 98.6 F (37 C)    GENERAL: vitals reviewed and listed below, alert, oriented, appears well hydrated and in no acute distress  HEENT: head atraumatic, PERRLA, normal appearance of eyes, ears, nose and mouth. moist mucus membranes.  NECK: supple, no masses or lymphadenopathy  LUNGS: clear to auscultation bilaterally, no rales, rhonchi or wheeze  CV: HRRR, no peripheral edema or cyanosis, normal pedal pulses  BREAST: Declined, sees GYN and oncology in surgery  ABDOMEN: bowel sounds normal, soft, non tender to palpation, no masses, no rebound or  guarding  GU: Declined, seeing GYN  SKIN: no rash or abnormal lesions, declined full exam, sees dermatologist  MS: normal gait, moves all extremities normally  NEURO: CN II-XII grossly intact, normal muscle strength and sensation to light touch on extremities  PSYCH: normal affect, pleasant and cooperative  ASSESSMENT AND PLAN:  Discussed the following assessment and plan:  Visit for preventive health examination - Plan: Lipid Panel, Hemoglobin A1c, Hep C Antibody  Breast cancer of upper-outer quadrant of right  female breast ([place])  Lynch syndrome - -Seeing GI, Dr. Darrick Grinder, [facility] for yearly survaillence-Seeing gyn in [place] for women's health and plans hysterectomy and oophorectomy   -Discussed and advised all [place] preventive services health task force level A and B recommendations for age, sex and risks.  -Advised at least 150 minutes of exercise per week and a healthy diet low in saturated fats and sweets and consisting of fresh fruits and vegetables, lean meats such as fish and white chicken and whole grains.  -FASTING labs, studies and vaccines per orders this encounter  -Advised the simvastatin to obtain records from other providers  -Reviewed recommendations oncology/genetic regarding Lynch syndrome, she is set up with a gastroenterologist and gynecologist in [place] per her report and is well aware of all of the recommendations   Orders Placed This Encounter  Procedures  . Lipid Panel  . Hemoglobin A1c  . Hep C Antibody    Patient advised to return to clinic immediately if symptoms worsen or persist or new concerns.  Patient Instructions  BEFORE YOU LEAVE: -prevnar 65 -permission/request records from Oncologist, Writer -follow up yearly -labs  -We have ordered labs or studies at this visit. It can take up to 1-2 weeks for results and processing. We will contact you with instructions IF your results are abnormal. Normal results will  be released to your [facility]. If you have not heard from [place] or can not find your results in [facility] in 2 weeks please contact our office.  We recommend the following healthy lifestyle measures: - eat a healthy whole foods diet consisting of regular small meals composed of vegetables, fruits, beans, nuts, seeds, healthy meats such as white chicken and fish and whole grains.  - avoid sweets, white starchy foods, fried foods, fast food, processed foods, sodas, red meet and other fattening foods.  - get a least 150-300 minutes of aerobic exercise per week.              No Follow-up on file.  Colin Benton R.

## 2016-01-20 NOTE — Progress Notes (Signed)
Pre visit review using our clinic review tool, if applicable. No additional management support is needed unless otherwise documented below in the visit note. 

## 2016-01-20 NOTE — Addendum Note (Signed)
Addended by: Agnes Lawrence on: 01/20/2016 08:58 AM   Modules accepted: Orders

## 2016-01-20 NOTE — Patient Instructions (Signed)
BEFORE YOU LEAVE: -prevnar 77 -permission/request records from Oncologist, Writer -follow up yearly -labs  -We have ordered labs or studies at this visit. It can take up to 1-2 weeks for results and processing. We will contact you with instructions IF your results are abnormal. Normal results will be released to your [facility]. If you have not heard from [place] or can not find your results in [facility] in 2 weeks please contact our office.  We recommend the following healthy lifestyle measures: - eat a healthy whole foods diet consisting of regular small meals composed of vegetables, fruits, beans, nuts, seeds, healthy meats such as white chicken and fish and whole grains.  - avoid sweets, white starchy foods, fried foods, fast food, processed foods, sodas, red meet and other fattening foods.  - get a least 150-300 minutes of aerobic exercise per week.

## 2016-01-21 LAB — HEPATITIS C ANTIBODY: HCV AB: NEGATIVE

## 2016-02-17 DIAGNOSIS — C50211 Malignant neoplasm of upper-inner quadrant of right female breast: Secondary | ICD-10-CM | POA: Diagnosis not present

## 2016-02-20 DIAGNOSIS — C50911 Malignant neoplasm of unspecified site of right female breast: Secondary | ICD-10-CM | POA: Diagnosis not present

## 2016-02-20 DIAGNOSIS — Z510 Encounter for antineoplastic radiation therapy: Secondary | ICD-10-CM | POA: Diagnosis not present

## 2016-02-20 DIAGNOSIS — Z171 Estrogen receptor negative status [ER-]: Secondary | ICD-10-CM | POA: Diagnosis not present

## 2016-02-21 DIAGNOSIS — C50911 Malignant neoplasm of unspecified site of right female breast: Secondary | ICD-10-CM | POA: Diagnosis not present

## 2016-02-21 DIAGNOSIS — Z171 Estrogen receptor negative status [ER-]: Secondary | ICD-10-CM | POA: Diagnosis not present

## 2016-02-21 DIAGNOSIS — Z510 Encounter for antineoplastic radiation therapy: Secondary | ICD-10-CM | POA: Diagnosis not present

## 2016-02-22 DIAGNOSIS — Z510 Encounter for antineoplastic radiation therapy: Secondary | ICD-10-CM | POA: Diagnosis not present

## 2016-02-22 DIAGNOSIS — C50911 Malignant neoplasm of unspecified site of right female breast: Secondary | ICD-10-CM | POA: Diagnosis not present

## 2016-02-22 DIAGNOSIS — Z171 Estrogen receptor negative status [ER-]: Secondary | ICD-10-CM | POA: Diagnosis not present

## 2016-02-23 DIAGNOSIS — C50911 Malignant neoplasm of unspecified site of right female breast: Secondary | ICD-10-CM | POA: Diagnosis not present

## 2016-02-23 DIAGNOSIS — Z171 Estrogen receptor negative status [ER-]: Secondary | ICD-10-CM | POA: Diagnosis not present

## 2016-02-23 DIAGNOSIS — Z510 Encounter for antineoplastic radiation therapy: Secondary | ICD-10-CM | POA: Diagnosis not present

## 2016-02-24 DIAGNOSIS — Z171 Estrogen receptor negative status [ER-]: Secondary | ICD-10-CM | POA: Diagnosis not present

## 2016-02-24 DIAGNOSIS — C50211 Malignant neoplasm of upper-inner quadrant of right female breast: Secondary | ICD-10-CM | POA: Diagnosis not present

## 2016-02-24 DIAGNOSIS — C50911 Malignant neoplasm of unspecified site of right female breast: Secondary | ICD-10-CM | POA: Diagnosis not present

## 2016-02-24 DIAGNOSIS — Z510 Encounter for antineoplastic radiation therapy: Secondary | ICD-10-CM | POA: Diagnosis not present

## 2016-02-27 DIAGNOSIS — Z171 Estrogen receptor negative status [ER-]: Secondary | ICD-10-CM | POA: Diagnosis not present

## 2016-02-27 DIAGNOSIS — C50911 Malignant neoplasm of unspecified site of right female breast: Secondary | ICD-10-CM | POA: Diagnosis not present

## 2016-02-27 DIAGNOSIS — Z510 Encounter for antineoplastic radiation therapy: Secondary | ICD-10-CM | POA: Diagnosis not present

## 2016-02-28 DIAGNOSIS — Z171 Estrogen receptor negative status [ER-]: Secondary | ICD-10-CM | POA: Diagnosis not present

## 2016-02-28 DIAGNOSIS — C50911 Malignant neoplasm of unspecified site of right female breast: Secondary | ICD-10-CM | POA: Diagnosis not present

## 2016-02-28 DIAGNOSIS — Z510 Encounter for antineoplastic radiation therapy: Secondary | ICD-10-CM | POA: Diagnosis not present

## 2016-02-29 DIAGNOSIS — Z171 Estrogen receptor negative status [ER-]: Secondary | ICD-10-CM | POA: Diagnosis not present

## 2016-02-29 DIAGNOSIS — C50911 Malignant neoplasm of unspecified site of right female breast: Secondary | ICD-10-CM | POA: Diagnosis not present

## 2016-02-29 DIAGNOSIS — Z510 Encounter for antineoplastic radiation therapy: Secondary | ICD-10-CM | POA: Diagnosis not present

## 2016-03-01 DIAGNOSIS — C50211 Malignant neoplasm of upper-inner quadrant of right female breast: Secondary | ICD-10-CM | POA: Diagnosis not present

## 2016-03-01 DIAGNOSIS — Z171 Estrogen receptor negative status [ER-]: Secondary | ICD-10-CM | POA: Diagnosis not present

## 2016-03-01 DIAGNOSIS — Z510 Encounter for antineoplastic radiation therapy: Secondary | ICD-10-CM | POA: Diagnosis not present

## 2016-03-01 DIAGNOSIS — C50911 Malignant neoplasm of unspecified site of right female breast: Secondary | ICD-10-CM | POA: Diagnosis not present

## 2016-03-05 DIAGNOSIS — C50911 Malignant neoplasm of unspecified site of right female breast: Secondary | ICD-10-CM | POA: Diagnosis not present

## 2016-03-05 DIAGNOSIS — Z510 Encounter for antineoplastic radiation therapy: Secondary | ICD-10-CM | POA: Diagnosis not present

## 2016-03-05 DIAGNOSIS — Z171 Estrogen receptor negative status [ER-]: Secondary | ICD-10-CM | POA: Diagnosis not present

## 2016-03-06 DIAGNOSIS — Z171 Estrogen receptor negative status [ER-]: Secondary | ICD-10-CM | POA: Diagnosis not present

## 2016-03-06 DIAGNOSIS — C50211 Malignant neoplasm of upper-inner quadrant of right female breast: Secondary | ICD-10-CM | POA: Diagnosis not present

## 2016-03-06 DIAGNOSIS — Z510 Encounter for antineoplastic radiation therapy: Secondary | ICD-10-CM | POA: Diagnosis not present

## 2016-03-06 DIAGNOSIS — C50911 Malignant neoplasm of unspecified site of right female breast: Secondary | ICD-10-CM | POA: Diagnosis not present

## 2016-03-07 DIAGNOSIS — C50911 Malignant neoplasm of unspecified site of right female breast: Secondary | ICD-10-CM | POA: Diagnosis not present

## 2016-03-07 DIAGNOSIS — Z510 Encounter for antineoplastic radiation therapy: Secondary | ICD-10-CM | POA: Diagnosis not present

## 2016-03-07 DIAGNOSIS — Z171 Estrogen receptor negative status [ER-]: Secondary | ICD-10-CM | POA: Diagnosis not present

## 2016-03-08 DIAGNOSIS — Z510 Encounter for antineoplastic radiation therapy: Secondary | ICD-10-CM | POA: Diagnosis not present

## 2016-03-08 DIAGNOSIS — C50911 Malignant neoplasm of unspecified site of right female breast: Secondary | ICD-10-CM | POA: Diagnosis not present

## 2016-03-08 DIAGNOSIS — Z171 Estrogen receptor negative status [ER-]: Secondary | ICD-10-CM | POA: Diagnosis not present

## 2016-03-09 DIAGNOSIS — C50911 Malignant neoplasm of unspecified site of right female breast: Secondary | ICD-10-CM | POA: Diagnosis not present

## 2016-03-09 DIAGNOSIS — Z171 Estrogen receptor negative status [ER-]: Secondary | ICD-10-CM | POA: Diagnosis not present

## 2016-03-09 DIAGNOSIS — Z510 Encounter for antineoplastic radiation therapy: Secondary | ICD-10-CM | POA: Diagnosis not present

## 2016-03-12 DIAGNOSIS — Z510 Encounter for antineoplastic radiation therapy: Secondary | ICD-10-CM | POA: Diagnosis not present

## 2016-03-12 DIAGNOSIS — C50211 Malignant neoplasm of upper-inner quadrant of right female breast: Secondary | ICD-10-CM | POA: Diagnosis not present

## 2016-03-12 DIAGNOSIS — Z171 Estrogen receptor negative status [ER-]: Secondary | ICD-10-CM | POA: Diagnosis not present

## 2016-03-12 DIAGNOSIS — Z1509 Genetic susceptibility to other malignant neoplasm: Secondary | ICD-10-CM | POA: Diagnosis not present

## 2016-03-12 DIAGNOSIS — C50911 Malignant neoplasm of unspecified site of right female breast: Secondary | ICD-10-CM | POA: Diagnosis not present

## 2016-03-13 DIAGNOSIS — Z171 Estrogen receptor negative status [ER-]: Secondary | ICD-10-CM | POA: Diagnosis not present

## 2016-03-13 DIAGNOSIS — Z510 Encounter for antineoplastic radiation therapy: Secondary | ICD-10-CM | POA: Diagnosis not present

## 2016-03-13 DIAGNOSIS — C50911 Malignant neoplasm of unspecified site of right female breast: Secondary | ICD-10-CM | POA: Diagnosis not present

## 2016-03-14 DIAGNOSIS — C50911 Malignant neoplasm of unspecified site of right female breast: Secondary | ICD-10-CM | POA: Diagnosis not present

## 2016-03-14 DIAGNOSIS — Z510 Encounter for antineoplastic radiation therapy: Secondary | ICD-10-CM | POA: Diagnosis not present

## 2016-03-14 DIAGNOSIS — Z171 Estrogen receptor negative status [ER-]: Secondary | ICD-10-CM | POA: Diagnosis not present

## 2016-03-15 DIAGNOSIS — Z171 Estrogen receptor negative status [ER-]: Secondary | ICD-10-CM | POA: Diagnosis not present

## 2016-03-15 DIAGNOSIS — Z510 Encounter for antineoplastic radiation therapy: Secondary | ICD-10-CM | POA: Diagnosis not present

## 2016-03-15 DIAGNOSIS — C50911 Malignant neoplasm of unspecified site of right female breast: Secondary | ICD-10-CM | POA: Diagnosis not present

## 2016-03-16 DIAGNOSIS — Z171 Estrogen receptor negative status [ER-]: Secondary | ICD-10-CM | POA: Diagnosis not present

## 2016-03-16 DIAGNOSIS — Z510 Encounter for antineoplastic radiation therapy: Secondary | ICD-10-CM | POA: Diagnosis not present

## 2016-03-16 DIAGNOSIS — C50911 Malignant neoplasm of unspecified site of right female breast: Secondary | ICD-10-CM | POA: Diagnosis not present

## 2016-03-19 DIAGNOSIS — C50211 Malignant neoplasm of upper-inner quadrant of right female breast: Secondary | ICD-10-CM | POA: Diagnosis not present

## 2016-03-19 DIAGNOSIS — Z510 Encounter for antineoplastic radiation therapy: Secondary | ICD-10-CM | POA: Diagnosis not present

## 2016-03-19 DIAGNOSIS — Z171 Estrogen receptor negative status [ER-]: Secondary | ICD-10-CM | POA: Diagnosis not present

## 2016-03-19 DIAGNOSIS — C50911 Malignant neoplasm of unspecified site of right female breast: Secondary | ICD-10-CM | POA: Diagnosis not present

## 2016-03-20 DIAGNOSIS — Z171 Estrogen receptor negative status [ER-]: Secondary | ICD-10-CM | POA: Diagnosis not present

## 2016-03-20 DIAGNOSIS — Z510 Encounter for antineoplastic radiation therapy: Secondary | ICD-10-CM | POA: Diagnosis not present

## 2016-03-20 DIAGNOSIS — C50911 Malignant neoplasm of unspecified site of right female breast: Secondary | ICD-10-CM | POA: Diagnosis not present

## 2016-03-21 DIAGNOSIS — Z171 Estrogen receptor negative status [ER-]: Secondary | ICD-10-CM | POA: Diagnosis not present

## 2016-03-21 DIAGNOSIS — C50911 Malignant neoplasm of unspecified site of right female breast: Secondary | ICD-10-CM | POA: Diagnosis not present

## 2016-03-21 DIAGNOSIS — Z510 Encounter for antineoplastic radiation therapy: Secondary | ICD-10-CM | POA: Diagnosis not present

## 2016-03-22 DIAGNOSIS — Z510 Encounter for antineoplastic radiation therapy: Secondary | ICD-10-CM | POA: Diagnosis not present

## 2016-03-22 DIAGNOSIS — Z171 Estrogen receptor negative status [ER-]: Secondary | ICD-10-CM | POA: Diagnosis not present

## 2016-03-22 DIAGNOSIS — C50911 Malignant neoplasm of unspecified site of right female breast: Secondary | ICD-10-CM | POA: Diagnosis not present

## 2016-03-23 DIAGNOSIS — C50911 Malignant neoplasm of unspecified site of right female breast: Secondary | ICD-10-CM | POA: Diagnosis not present

## 2016-03-23 DIAGNOSIS — Z510 Encounter for antineoplastic radiation therapy: Secondary | ICD-10-CM | POA: Diagnosis not present

## 2016-03-23 DIAGNOSIS — C50211 Malignant neoplasm of upper-inner quadrant of right female breast: Secondary | ICD-10-CM | POA: Diagnosis not present

## 2016-03-23 DIAGNOSIS — Z171 Estrogen receptor negative status [ER-]: Secondary | ICD-10-CM | POA: Diagnosis not present

## 2016-03-26 DIAGNOSIS — Z171 Estrogen receptor negative status [ER-]: Secondary | ICD-10-CM | POA: Diagnosis not present

## 2016-03-26 DIAGNOSIS — Z510 Encounter for antineoplastic radiation therapy: Secondary | ICD-10-CM | POA: Diagnosis not present

## 2016-03-26 DIAGNOSIS — C50911 Malignant neoplasm of unspecified site of right female breast: Secondary | ICD-10-CM | POA: Diagnosis not present

## 2016-03-27 DIAGNOSIS — Z171 Estrogen receptor negative status [ER-]: Secondary | ICD-10-CM | POA: Diagnosis not present

## 2016-03-27 DIAGNOSIS — Z510 Encounter for antineoplastic radiation therapy: Secondary | ICD-10-CM | POA: Diagnosis not present

## 2016-03-27 DIAGNOSIS — C50911 Malignant neoplasm of unspecified site of right female breast: Secondary | ICD-10-CM | POA: Diagnosis not present

## 2016-03-28 DIAGNOSIS — Z510 Encounter for antineoplastic radiation therapy: Secondary | ICD-10-CM | POA: Diagnosis not present

## 2016-03-28 DIAGNOSIS — C50911 Malignant neoplasm of unspecified site of right female breast: Secondary | ICD-10-CM | POA: Diagnosis not present

## 2016-03-28 DIAGNOSIS — Z171 Estrogen receptor negative status [ER-]: Secondary | ICD-10-CM | POA: Diagnosis not present

## 2016-03-29 DIAGNOSIS — Z510 Encounter for antineoplastic radiation therapy: Secondary | ICD-10-CM | POA: Diagnosis not present

## 2016-03-29 DIAGNOSIS — Z171 Estrogen receptor negative status [ER-]: Secondary | ICD-10-CM | POA: Diagnosis not present

## 2016-03-29 DIAGNOSIS — C50211 Malignant neoplasm of upper-inner quadrant of right female breast: Secondary | ICD-10-CM | POA: Diagnosis not present

## 2016-03-29 DIAGNOSIS — C50911 Malignant neoplasm of unspecified site of right female breast: Secondary | ICD-10-CM | POA: Diagnosis not present

## 2016-05-07 ENCOUNTER — Encounter: Payer: Self-pay | Admitting: Family Medicine

## 2016-05-07 DIAGNOSIS — K635 Polyp of colon: Secondary | ICD-10-CM | POA: Diagnosis not present

## 2016-05-07 DIAGNOSIS — Z86010 Personal history of colonic polyps: Secondary | ICD-10-CM | POA: Diagnosis not present

## 2016-05-07 DIAGNOSIS — K228 Other specified diseases of esophagus: Secondary | ICD-10-CM | POA: Diagnosis not present

## 2016-05-07 DIAGNOSIS — K5730 Diverticulosis of large intestine without perforation or abscess without bleeding: Secondary | ICD-10-CM | POA: Diagnosis not present

## 2016-05-07 DIAGNOSIS — Z1509 Genetic susceptibility to other malignant neoplasm: Secondary | ICD-10-CM | POA: Diagnosis not present

## 2016-05-07 DIAGNOSIS — D120 Benign neoplasm of cecum: Secondary | ICD-10-CM | POA: Diagnosis not present

## 2016-05-07 DIAGNOSIS — C189 Malignant neoplasm of colon, unspecified: Secondary | ICD-10-CM | POA: Diagnosis not present

## 2016-05-07 DIAGNOSIS — Z85038 Personal history of other malignant neoplasm of large intestine: Secondary | ICD-10-CM | POA: Diagnosis not present

## 2016-05-07 DIAGNOSIS — K449 Diaphragmatic hernia without obstruction or gangrene: Secondary | ICD-10-CM | POA: Diagnosis not present

## 2016-05-07 DIAGNOSIS — K640 First degree hemorrhoids: Secondary | ICD-10-CM | POA: Diagnosis not present

## 2016-05-07 DIAGNOSIS — Z13810 Encounter for screening for upper gastrointestinal disorder: Secondary | ICD-10-CM | POA: Diagnosis not present

## 2016-05-07 DIAGNOSIS — K209 Esophagitis, unspecified: Secondary | ICD-10-CM | POA: Diagnosis not present

## 2016-05-07 LAB — HM COLONOSCOPY

## 2016-05-15 DIAGNOSIS — D701 Agranulocytosis secondary to cancer chemotherapy: Secondary | ICD-10-CM | POA: Diagnosis not present

## 2016-05-15 DIAGNOSIS — Z5189 Encounter for other specified aftercare: Secondary | ICD-10-CM | POA: Diagnosis not present

## 2016-05-15 DIAGNOSIS — Z853 Personal history of malignant neoplasm of breast: Secondary | ICD-10-CM | POA: Diagnosis not present

## 2016-05-15 DIAGNOSIS — C50411 Malignant neoplasm of upper-outer quadrant of right female breast: Secondary | ICD-10-CM | POA: Diagnosis not present

## 2016-05-15 DIAGNOSIS — Z13820 Encounter for screening for osteoporosis: Secondary | ICD-10-CM | POA: Diagnosis not present

## 2016-05-15 DIAGNOSIS — R922 Inconclusive mammogram: Secondary | ICD-10-CM | POA: Diagnosis not present

## 2016-05-15 DIAGNOSIS — D649 Anemia, unspecified: Secondary | ICD-10-CM | POA: Diagnosis not present

## 2016-05-15 DIAGNOSIS — M899 Disorder of bone, unspecified: Secondary | ICD-10-CM | POA: Diagnosis not present

## 2016-05-15 DIAGNOSIS — J9811 Atelectasis: Secondary | ICD-10-CM | POA: Diagnosis not present

## 2016-05-16 DIAGNOSIS — C50211 Malignant neoplasm of upper-inner quadrant of right female breast: Secondary | ICD-10-CM | POA: Diagnosis not present

## 2016-05-17 DIAGNOSIS — N83209 Unspecified ovarian cyst, unspecified side: Secondary | ICD-10-CM | POA: Diagnosis not present

## 2016-05-17 DIAGNOSIS — Z8481 Family history of carrier of genetic disease: Secondary | ICD-10-CM | POA: Diagnosis not present

## 2016-05-17 DIAGNOSIS — C50919 Malignant neoplasm of unspecified site of unspecified female breast: Secondary | ICD-10-CM | POA: Diagnosis not present

## 2016-05-23 DIAGNOSIS — Z85828 Personal history of other malignant neoplasm of skin: Secondary | ICD-10-CM | POA: Diagnosis not present

## 2016-05-23 DIAGNOSIS — D225 Melanocytic nevi of trunk: Secondary | ICD-10-CM | POA: Diagnosis not present

## 2016-05-23 DIAGNOSIS — L814 Other melanin hyperpigmentation: Secondary | ICD-10-CM | POA: Diagnosis not present

## 2016-05-23 DIAGNOSIS — Z86018 Personal history of other benign neoplasm: Secondary | ICD-10-CM | POA: Diagnosis not present

## 2016-05-23 DIAGNOSIS — D1800 Hemangioma unspecified site: Secondary | ICD-10-CM | POA: Diagnosis not present

## 2016-05-23 DIAGNOSIS — D869 Sarcoidosis, unspecified: Secondary | ICD-10-CM | POA: Diagnosis not present

## 2016-05-23 DIAGNOSIS — L821 Other seborrheic keratosis: Secondary | ICD-10-CM | POA: Diagnosis not present

## 2016-05-24 DIAGNOSIS — D869 Sarcoidosis, unspecified: Secondary | ICD-10-CM | POA: Diagnosis not present

## 2016-05-24 DIAGNOSIS — Z853 Personal history of malignant neoplasm of breast: Secondary | ICD-10-CM | POA: Diagnosis not present

## 2016-05-24 DIAGNOSIS — Z01818 Encounter for other preprocedural examination: Secondary | ICD-10-CM | POA: Diagnosis not present

## 2016-05-24 DIAGNOSIS — Z87891 Personal history of nicotine dependence: Secondary | ICD-10-CM | POA: Diagnosis not present

## 2016-05-24 DIAGNOSIS — Z1509 Genetic susceptibility to other malignant neoplasm: Secondary | ICD-10-CM | POA: Diagnosis not present

## 2016-05-24 DIAGNOSIS — Z9221 Personal history of antineoplastic chemotherapy: Secondary | ICD-10-CM | POA: Diagnosis not present

## 2016-06-05 DIAGNOSIS — C50411 Malignant neoplasm of upper-outer quadrant of right female breast: Secondary | ICD-10-CM | POA: Diagnosis not present

## 2016-06-05 DIAGNOSIS — Z7983 Long term (current) use of bisphosphonates: Secondary | ICD-10-CM | POA: Diagnosis not present

## 2016-06-05 DIAGNOSIS — D701 Agranulocytosis secondary to cancer chemotherapy: Secondary | ICD-10-CM | POA: Diagnosis not present

## 2016-06-05 DIAGNOSIS — Z08 Encounter for follow-up examination after completed treatment for malignant neoplasm: Secondary | ICD-10-CM | POA: Diagnosis not present

## 2016-06-08 DIAGNOSIS — N9489 Other specified conditions associated with female genital organs and menstrual cycle: Secondary | ICD-10-CM | POA: Diagnosis not present

## 2016-06-08 DIAGNOSIS — N736 Female pelvic peritoneal adhesions (postinfective): Secondary | ICD-10-CM | POA: Diagnosis not present

## 2016-06-08 DIAGNOSIS — D259 Leiomyoma of uterus, unspecified: Secondary | ICD-10-CM | POA: Diagnosis not present

## 2016-06-08 DIAGNOSIS — Z853 Personal history of malignant neoplasm of breast: Secondary | ICD-10-CM | POA: Diagnosis not present

## 2016-06-08 DIAGNOSIS — Z803 Family history of malignant neoplasm of breast: Secondary | ICD-10-CM | POA: Diagnosis not present

## 2016-06-08 DIAGNOSIS — N83209 Unspecified ovarian cyst, unspecified side: Secondary | ICD-10-CM | POA: Diagnosis not present

## 2016-06-08 DIAGNOSIS — K660 Peritoneal adhesions (postprocedural) (postinfection): Secondary | ICD-10-CM | POA: Diagnosis not present

## 2016-06-08 DIAGNOSIS — D869 Sarcoidosis, unspecified: Secondary | ICD-10-CM | POA: Diagnosis not present

## 2016-06-08 DIAGNOSIS — Z87891 Personal history of nicotine dependence: Secondary | ICD-10-CM | POA: Diagnosis not present

## 2016-06-08 DIAGNOSIS — Z1509 Genetic susceptibility to other malignant neoplasm: Secondary | ICD-10-CM | POA: Diagnosis not present

## 2016-06-08 DIAGNOSIS — N83311 Acquired atrophy of right ovary: Secondary | ICD-10-CM | POA: Diagnosis not present

## 2016-06-08 HISTORY — PX: ABDOMINAL HYSTERECTOMY: SHX81

## 2016-06-09 ENCOUNTER — Encounter: Payer: Self-pay | Admitting: Family Medicine

## 2016-06-09 DIAGNOSIS — Z1509 Genetic susceptibility to other malignant neoplasm: Secondary | ICD-10-CM | POA: Diagnosis not present

## 2016-06-09 DIAGNOSIS — N83311 Acquired atrophy of right ovary: Secondary | ICD-10-CM | POA: Diagnosis not present

## 2016-06-09 DIAGNOSIS — N83209 Unspecified ovarian cyst, unspecified side: Secondary | ICD-10-CM | POA: Diagnosis not present

## 2016-06-09 DIAGNOSIS — Z87891 Personal history of nicotine dependence: Secondary | ICD-10-CM | POA: Diagnosis not present

## 2016-06-09 DIAGNOSIS — N9489 Other specified conditions associated with female genital organs and menstrual cycle: Secondary | ICD-10-CM | POA: Diagnosis not present

## 2016-06-09 DIAGNOSIS — D259 Leiomyoma of uterus, unspecified: Secondary | ICD-10-CM | POA: Diagnosis not present

## 2016-06-15 ENCOUNTER — Encounter: Payer: Managed Care, Other (non HMO) | Admitting: Gynecology

## 2016-06-28 DIAGNOSIS — Z171 Estrogen receptor negative status [ER-]: Secondary | ICD-10-CM | POA: Diagnosis not present

## 2016-06-28 DIAGNOSIS — C50811 Malignant neoplasm of overlapping sites of right female breast: Secondary | ICD-10-CM | POA: Diagnosis not present

## 2016-07-17 DIAGNOSIS — I712 Thoracic aortic aneurysm, without rupture: Secondary | ICD-10-CM | POA: Diagnosis not present

## 2016-07-20 ENCOUNTER — Encounter: Payer: Self-pay | Admitting: Family Medicine

## 2016-07-20 DIAGNOSIS — I714 Abdominal aortic aneurysm, without rupture, unspecified: Secondary | ICD-10-CM | POA: Insufficient documentation

## 2016-08-21 DIAGNOSIS — Z23 Encounter for immunization: Secondary | ICD-10-CM | POA: Diagnosis not present

## 2016-08-23 DIAGNOSIS — I712 Thoracic aortic aneurysm, without rupture: Secondary | ICD-10-CM | POA: Diagnosis not present

## 2016-09-05 ENCOUNTER — Ambulatory Visit (INDEPENDENT_AMBULATORY_CARE_PROVIDER_SITE_OTHER): Payer: Medicare Other | Admitting: Adult Health

## 2016-09-05 ENCOUNTER — Encounter: Payer: Self-pay | Admitting: Adult Health

## 2016-09-05 VITALS — BP 118/76 | Temp 98.6°F | Ht 67.5 in | Wt 173.2 lb

## 2016-09-05 DIAGNOSIS — N3000 Acute cystitis without hematuria: Secondary | ICD-10-CM

## 2016-09-05 LAB — POCT URINALYSIS DIPSTICK
Bilirubin, UA: NEGATIVE
Glucose, UA: NEGATIVE
Nitrite, UA: NEGATIVE
PH UA: 6
Spec Grav, UA: 1.025
Urobilinogen, UA: NEGATIVE

## 2016-09-05 MED ORDER — SULFAMETHOXAZOLE-TRIMETHOPRIM 800-160 MG PO TABS: 1.0000 | ORAL_TABLET | Freq: Two times a day (BID) | ORAL | 0 refills | Status: DC

## 2016-09-05 NOTE — Progress Notes (Signed)
Subjective:    Patient ID: Mackenzie Norton, female    DOB: 1950-12-03, 65 y.o.   MRN: [id number]  Urinary Tract Infection   This is a recurrent problem. The current episode started in the past 7 days. The problem has been unchanged. The pain is at a severity of 3/10. The pain is mild. The maximum temperature recorded prior to her arrival was more than 104 F. There is no history of pyelonephritis. Associated symptoms include frequency and urgency. Pertinent negatives include no flank pain or hematuria. She has tried increased fluids for the symptoms. The treatment provided mild relief. Her past medical history is significant for recurrent UTIs. There is no history of kidney stones, a single kidney or a urological procedure.      Review of Systems  Constitutional: Negative.   Genitourinary: Positive for dysuria, frequency and urgency. Negative for flank pain, hematuria and pelvic pain.  All other systems reviewed and are negative.  Past Medical History:  Diagnosis Date  . Allergic rhinitis   . Cancer [facility])    Breast cancer  . Diverticulosis of colon   . Diverticulosis of large intestine 09/17/2007   Qualifier: Diagnosis of  By: Arnoldo Morale MD, Balinda Quails   . Family history of breast cancer   . Lynch syndrome    annaul colonoscopy, GI survailence, annual urinalysis and gyn management advised  . Osteopenia 06/2014   T score -1.5 FRAX 14%/1.4%  . Ovarian cyst    Left and Right  . Pectus excavatum   . Sarcoidosis ([place])    ? of this on skin bx with dermatologist, no symptoms, never symptomatic - sees Dr. Tonia Brooms  . Vertebral compression fracture (HCC)    L-6    Social History   Social History  . Marital status: Widowed    Spouse name: N/A  . Number of children: N/A  . Years of education: N/A   Occupational History  . Not on file.   Social History Main Topics  . Smoking status: Former Smoker    Packs/day: 1.00    Years: 14.00    Types: Cigarettes    Quit date: 08/19/1980  .  Smokeless tobacco: Never Used  . Alcohol use 1.2 oz/week    2 Standard drinks or equivalent per week  . Drug use: No  . Sexual activity: No     Comment: 1st intercourse 65 yo-Fewer than 5 partners   Other Topics Concern  . 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 BREAST SURGERY     Biopsy  . COLONOSCOPY  12/17/05  . mole removal and skin cancer surgery at [place]  . OOPHORECTOMY  1990   LSO  . ROBOTIC ASSISTED TOTAL HYSTERECTOMY  2017   with R salpingo-oophorectomy for Lynch Syndrome  . TONSILLECTOMY    . WRIST SURGERY      Family History  Problem Relation Age of Onset  . Arthritis Mother   . Parkinsonism Mother   . Leukemia Father 45    CLL  . Breast cancer Maternal Grandmother     Age  76  . Heart disease Maternal Grandmother   . Heart disease Maternal Grandfather   . Breast cancer Paternal Grandmother     Age 79  . Cancer Paternal Grandfather     Esophageal cancer  . Breast cancer Paternal Aunt 37  . Thyroid cancer Paternal Aunt 79  . Heart disease Paternal Uncle   . Multiple sclerosis Maternal Uncle   . Lung cancer Paternal Aunt     smoker    Allergies  Allergen Reactions  . Penicillins     REACTION: ??    Current Outpatient Prescriptions on File Prior to Visit  Medication Sig Dispense Refill  . Biotin 10 MG TABS Take by mouth daily.      . Calcium-Vitamin D 600-200 MG-UNIT per tablet Take 1 tablet by mouth 2 (two) times daily.      . Cholecalciferol (VITAMIN D3) 1000 UNITS CAPS Take 2,000 Units by mouth daily.      Marland Kitchen CRANBERRY EXTRACT PO Take 300 mg by mouth.    Mariane Baumgarten Calcium (STOOL SOFTENER PO) Take by mouth.    . fexofenadine (ALLEGRA) 30 MG tablet Take  30 mg by mouth as needed.     . fish oil-omega-3 fatty acids 1000 MG capsule Take 1 g by mouth daily.      . Glucosamine-Chondroit-Vit C-Mn (GLUCOSAMINE CHONDR 500 COMPLEX PO) Take by mouth.      . Magnesium (ESSENTIAL MAGNESIUM) 250 MG TABS Take by mouth daily.      . NON FORMULARY Digestive enzyme    . Nutritional Supplements (JUICE PLUS FIBRE PO) Take by mouth daily.      . Pyridoxine HCl (VITAMIN B6 PO) Take by mouth.    . vitamin B-12 (CYANOCOBALAMIN) 250 MCG tablet Take 250 mcg by mouth daily.     No current facility-administered medications on file prior to visit.     BP 118/76   Temp 98.6 F (37 C) (Oral)   Ht 5' 7.5" (1.715 m)   Wt 173 lb 3.2 oz (78.6 kg)   BMI 26.73 kg/m       Objective:   Physical Exam  Constitutional: She is oriented to person, place, and time. She appears well-developed and well-nourished. No distress.  Cardiovascular: Normal rate, regular rhythm, normal heart sounds and intact distal pulses.  Exam reveals no gallop and no friction rub.   No murmur heard. Pulmonary/Chest: Effort normal and breath sounds normal. No respiratory distress. She has no wheezes. She has no rales. She exhibits no tenderness.  Abdominal: Soft. Normal appearance, normal aorta and bowel sounds are normal. She exhibits no distension and no mass. There is no tenderness. There is no rebound, no guarding and no CVA tenderness.  Neurological: She is alert and oriented to person, place, and time.  Skin: Skin is warm and dry. No rash noted. She is not diaphoretic. No erythema. No pallor.  Psychiatric: She has a normal mood and affect. Her behavior is normal. Judgment and thought content normal.  Nursing note and vitals reviewed.      Assessment & Plan:  1. Acute cystitis without hematuria - POCT urinalysis dipstick + 3 lueks - Urine culture - sulfamethoxazole-trimethoprim (BACTRIM DS) 800-160 MG tablet; Take 1 tablet by mouth 2 (two) times daily.  Dispense: 6 tablet; Refill: 0 -  Continue to drink plenty of water and take abx as directed - Will follow up with after culture is back and change abx if needed - Follow up as needed  Dorothyann Peng, NP

## 2016-09-07 LAB — URINE CULTURE: Organism ID, Bacteria: NO GROWTH

## 2016-12-10 DIAGNOSIS — C50411 Malignant neoplasm of upper-outer quadrant of right female breast: Secondary | ICD-10-CM | POA: Diagnosis not present

## 2016-12-10 DIAGNOSIS — E559 Vitamin D deficiency, unspecified: Secondary | ICD-10-CM | POA: Diagnosis not present

## 2016-12-10 DIAGNOSIS — Z08 Encounter for follow-up examination after completed treatment for malignant neoplasm: Secondary | ICD-10-CM | POA: Diagnosis not present

## 2016-12-11 DIAGNOSIS — Z853 Personal history of malignant neoplasm of breast: Secondary | ICD-10-CM | POA: Diagnosis not present

## 2016-12-11 DIAGNOSIS — Z171 Estrogen receptor negative status [ER-]: Secondary | ICD-10-CM | POA: Diagnosis not present

## 2016-12-11 DIAGNOSIS — Z08 Encounter for follow-up examination after completed treatment for malignant neoplasm: Secondary | ICD-10-CM | POA: Diagnosis not present

## 2016-12-11 DIAGNOSIS — Z803 Family history of malignant neoplasm of breast: Secondary | ICD-10-CM | POA: Diagnosis not present

## 2016-12-11 DIAGNOSIS — R922 Inconclusive mammogram: Secondary | ICD-10-CM | POA: Diagnosis not present

## 2016-12-11 DIAGNOSIS — Z7983 Long term (current) use of bisphosphonates: Secondary | ICD-10-CM | POA: Diagnosis not present

## 2016-12-11 LAB — HM MAMMOGRAPHY

## 2016-12-13 ENCOUNTER — Encounter: Payer: Self-pay | Admitting: Family Medicine

## 2017-01-20 NOTE — Progress Notes (Signed)
Medicare Annual Preventive Care Visit  (initial annual wellness or annual wellness exam)  Concerns and/or follow up today:  PMH significant for Breast Ca, Lynch Syndrome, PMS2 gene mutation (sees oncology, GI, gynecologist, dermatologist for this). [name] S/p total hysterectomy, R salpingo-oophorectomy in 2017. Sees GI for colonoscopies. Sees dermatologist for regular skin checks. Had dexa in last one year. UTD on most preventive measures. Report has been feeling great. Is fasting and does want to do lipid and diabetes screening. Exercises 2 days per week and is interested in discussing healthy diet. Portion size is sometimes an issue. See scanned documentation for further details.  ROS: negative for report of fevers, unintentional weight loss, vision changes, vision loss, hearing loss or change, chest pain, sob, hemoptysis, melena, hematochezia, hematuria, genital discharge or lesions, falls, bleeding or bruising, loc, thoughts of suicide or self harm, memory loss  1.) Patient-completed health risk assessment  - completed and reviewed, see scanned documentation  2.) Review of Medical History: -PMH, PSH, Family History and current specialty and care providers reviewed and updated and listed below  - see scanned in document in chart and below  Past Medical History:  Diagnosis Date  . Allergic rhinitis   . Cancer [facility])    Breast cancer  . Diverticulosis of colon   . Diverticulosis of large intestine 09/17/2007   Qualifier: Diagnosis of  By: Arnoldo Morale MD, Balinda Quails   . Family history of breast cancer   . Lynch syndrome    annaul colonoscopy, GI survailence, annual urinalysis and gyn management advised  . Osteopenia 06/2014   T score -1.5 FRAX 14%/1.4%  . Ovarian cyst    Left and Right  . Pectus excavatum   . Sarcoidosis ([place])    ? of this on skin bx with dermatologist, no symptoms, never symptomatic - sees Dr. Tonia Brooms  . Vertebral compression fracture  (HCC)    L-6    Past Surgical History:  Procedure Laterality Date  . ABDOMINAL HYSTERECTOMY  06/08/2016   [facility]-Dr Nancy Marus per patient  . BREAST SURGERY     Biopsy  . COLONOSCOPY  12/17/05  . mole removal and skin cancer surgery at [place]  . OOPHORECTOMY  1990   LSO  . ROBOTIC ASSISTED TOTAL HYSTERECTOMY  2017   with R salpingo-oophorectomy for Lynch Syndrome  . TONSILLECTOMY    . WRIST SURGERY      Social History   Social History  . Marital status: Widowed    Spouse name: N/A  . Number of children: N/A  . Years of education: N/A   Occupational History  . Not on file.   Social History Main Topics  . Smoking status: Former Smoker    Packs/day: 1.00    Years: 14.00    Types: Cigarettes    Quit date: 08/19/1980  . Smokeless tobacco: Never Used  . Alcohol use 1.2 oz/week    2 Standard drinks or equivalent per week  . Drug use: No  . Sexual activity: No     Comment: 1st intercourse 66 yo-Fewer than 5 partners   Other Topics Concern  . 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 Arthritis Mother   . Parkinsonism Mother   . Leukemia Father 3    CLL  . Breast cancer Maternal Grandmother     Age 44  . Heart disease Maternal Grandmother   . Heart disease Maternal Grandfather   . Breast cancer Paternal Grandmother     Age 90  . Cancer Paternal Grandfather     Esophageal cancer  . Breast cancer Paternal Aunt 73  . Thyroid cancer Paternal Aunt 23  . Heart disease Paternal Uncle   . Multiple sclerosis Maternal Uncle   . Lung cancer Paternal Aunt     smoker    Current Outpatient Prescriptions on File Prior to Visit  Medication Sig Dispense Refill  .  Biotin 10 MG TABS Take by mouth daily.      . Calcium-Vitamin D 600-200 MG-UNIT per tablet Take 1 tablet by mouth 2 (two) times daily.      . Cholecalciferol (VITAMIN D3) 1000 UNITS CAPS Take 2,000 Units by mouth daily.      Marland Kitchen CRANBERRY EXTRACT PO Take 300 mg by mouth.    Mackenzie Norton Calcium (STOOL SOFTENER PO) Take by mouth.    . fexofenadine (ALLEGRA) 30 MG tablet Take 30 mg by mouth as needed.     . fish oil-omega-3 fatty acids 1000 MG capsule Take 1 g by mouth daily.      . Glucosamine-Chondroit-Vit C-Mn (GLUCOSAMINE CHONDR 500 COMPLEX PO) Take by mouth.      . Magnesium (ESSENTIAL MAGNESIUM) 250 MG TABS Take by mouth daily.      . NON FORMULARY Digestive enzyme    . Nutritional Supplements (JUICE PLUS FIBRE PO) Take by mouth daily.       No current facility-administered medications on file prior to visit.      3.) Review of functional ability and level of safety:  Any difficulty hearing?  See scanned documentation  History of falling?  See scanned documentation  Any trouble with IADLs - using a phone, using transportation, grocery shopping, preparing meals, doing housework, doing laundry, taking medications and managing money?  See scanned documentation  Advance Directives?  Discussed briefly and offered more resources and detailed discussion with our trained staff.   See summary of recommendations in Patient Instructions below.  4.) Physical Exam Vitals:   01/21/17 0819  BP: 112/70  Temp: 97.9 F (36.6 C)   Estimated body mass index is 27.6 kg/m as calculated from the following:   Height as of this encounter: '5\' 7"'$  (1.702 m).   Weight as of this encounter: 176 lb 3.2 oz (79.9 kg).  EKG (optional): deferred  General: alert, appear well hydrated and in no acute distress  HEENT: visual acuity grossly intact  CV: HRRR  Lungs: CTA bilaterally  Psych: pleasant and cooperative, no obvious depression or anxiety  Cognitive function grossly intact  See patient  instructions for recommendations.  Education and counseling regarding the above review of health provided with a plan for the following: -see scanned patient completed form for further details -fall prevention strategies discussed  -healthy lifestyle discussed -importance and resources for completing advanced directives discussed -see patient instructions below for any other recommendations provided  4)The following written screening schedule of preventive measures were reviewed with assessment and plan made per below, orders and patient instructions:      AAA screening done if applicable  Alcohol screening done     Obesity Screening and counseling done     STI screening (Hep C if born 72-65) offered and per pt wishes     Tobacco Screening done        Pneumococcal (PPSV23 -one dose after 64, one before if risk factors), influenza yearly and hepatitis B vaccines (if high risk - end stage renal disease, IV drugs, homosexual men, live in home for mentally retarded, hemophilia receiving factors) ASSESSMENT/PLAN: done today      Screening mammograph (yearly if >40) ASSESSMENT/PLAN: utd, sees oncologist and gyn      Screening Pap smear/pelvic exam (q2 years) ASSESSMENT/PLAN: n/a, declined - s/p hysterectomy and bilateral oophorectomy      Colorectal cancer screening (FOBT yearly or flex sig q4y or colonoscopy q10y or barium enema q4y) ASSESSMENT/PLAN: last done 04/2016 - sees GI      Diabetes outpatient self-management training services ASSESSMENT/PLAN: n/s      Bone mass measurements(covered q2y if indicated - estrogen def, osteoporosis, hyperparathyroid, vertebral abnormalities, osteoporosis or steroids) ASSESSMENT/PLAN: done per pt in last 1 year      Screening for glaucoma(q1y if high risk - diabetes, FH, AA and > 50 or hispanic and > 65) ASSESSMENT/PLAN: reports see optho yearly      Medical nutritional therapy for individuals with diabetes or renal  disease ASSESSMENT/PLAN:n/a      Cardiovascular screening blood tests (lipids q5y) ASSESSMENT/PLAN: see orders and labs      Diabetes screening tests ASSESSMENT/PLAN: see orders and labs   7.) Summary:  Medicare annual wellness visit, initial -risk factors and conditions per above assessment were discussed and treatment, recommendations and referrals were offered per documentation above and orders and patient instructions.  Lynch syndrome -sees a number of specialist for very careful surveillance  Osteopenia, unspecified location -saw specialist for last dex in last 1 year  Patient Instructions  BEFORE YOU LEAVE: -pneumococcal (PPSV23) -follow up: yearly for Medicare Wellness exam and as needed -labs  We have ordered labs or studies at this visit. It can take up to 1-2 weeks for results and processing. IF results require follow up or explanation, we will call you with instructions. Clinically stable results will be released to your [facility]. If you have not heard from [place] or cannot find your results in [facility] in 2 weeks please contact our office at [phone].  If you are not yet signed up for [facility], please consider signing up.   We recommend the following healthy lifestyle for LIFE: Consider the Mediterranean lifestyle. 1) Small portions.   Tip: eat off of a salad plate instead of a dinner plate.  Tip: It is ok to feel hungry after a meal of proper portion sizes.  Tip: if you need more or a snack choose fruits, veggies and/or a handful of nuts or seeds.  2) Eat a healthy clean diet.  * Tip: Avoid (less then 1 serving per week): processed foods, sweets, sweetened drinks, white starches (rice, flour, bread, potatoes, pasta, etc), red meat, fast foods, butter  *Tip: CHOOSE instead   * 5-9 servings per day of fresh or frozen fruits and vegetables (but not corn, potatoes, bananas, canned or dried fruit)   *nuts and seeds, beans   *olives and olive oil   *small portions of  lean meats such as fish and white chicken    *small portions of whole grains  3)Get at least 150 minutes of sweaty aerobic exercise per week.  4)Reduce stress - consider counseling,  meditation and relaxation to balance other aspects of your life.            Colin Benton R., DO

## 2017-01-21 ENCOUNTER — Ambulatory Visit (INDEPENDENT_AMBULATORY_CARE_PROVIDER_SITE_OTHER): Payer: Medicare Other | Admitting: Family Medicine

## 2017-01-21 ENCOUNTER — Encounter: Payer: Self-pay | Admitting: Family Medicine

## 2017-01-21 ENCOUNTER — Encounter: Payer: Managed Care, Other (non HMO) | Admitting: Family Medicine

## 2017-01-21 VITALS — BP 112/70 | Temp 97.9°F | Ht 67.0 in | Wt 176.2 lb

## 2017-01-21 DIAGNOSIS — Z1509 Genetic susceptibility to other malignant neoplasm: Secondary | ICD-10-CM | POA: Diagnosis not present

## 2017-01-21 DIAGNOSIS — Z0000 Encounter for general adult medical examination without abnormal findings: Secondary | ICD-10-CM | POA: Diagnosis not present

## 2017-01-21 DIAGNOSIS — M8580 Other specified disorders of bone density and structure, unspecified site: Secondary | ICD-10-CM | POA: Diagnosis not present

## 2017-01-21 DIAGNOSIS — Z23 Encounter for immunization: Secondary | ICD-10-CM

## 2017-01-21 DIAGNOSIS — E785 Hyperlipidemia, unspecified: Secondary | ICD-10-CM

## 2017-01-21 DIAGNOSIS — R739 Hyperglycemia, unspecified: Secondary | ICD-10-CM | POA: Diagnosis not present

## 2017-01-21 LAB — LIPID PANEL
CHOLESTEROL: 166 mg/dL (ref 0–200)
HDL: 68.9 mg/dL (ref >39.00)
LDL CALC: 83 mg/dL (ref 0–99)
NonHDL: 97.21
TRIGLYCERIDES: 71 mg/dL (ref 0.0–149.0)
Total CHOL/HDL Ratio: 2
VLDL: 14.2 mg/dL (ref 0.0–40.0)

## 2017-01-21 LAB — HEMOGLOBIN A1C: HEMOGLOBIN A1C: 5.5 % (ref 4.6–6.5)

## 2017-01-21 NOTE — Addendum Note (Signed)
Addended by: Lucretia Kern on: 01/21/2017 09:00 AM   Modules accepted: Orders

## 2017-01-21 NOTE — Addendum Note (Signed)
Addended by: Agnes Lawrence on: 01/21/2017 09:06 AM   Modules accepted: Orders

## 2017-01-21 NOTE — Patient Instructions (Signed)
BEFORE YOU LEAVE: -pneumococcal (PPSV23) -follow up: yearly for Medicare Wellness exam and as needed -labs  We have ordered labs or studies at this visit. It can take up to 1-2 weeks for results and processing. IF results require follow up or explanation, we will call you with instructions. Clinically stable results will be released to your [facility]. If you have not heard from [place] or cannot find your results in [facility] in 2 weeks please contact our office at [phone].  If you are not yet signed up for [facility], please consider signing up.   We recommend the following healthy lifestyle for LIFE: Consider the Mediterranean lifestyle. 1) Small portions.   Tip: eat off of a salad plate instead of a dinner plate.  Tip: It is ok to feel hungry after a meal of proper portion sizes.  Tip: if you need more or a snack choose fruits, veggies and/or a handful of nuts or seeds.  2) Eat a healthy clean diet.  * Tip: Avoid (less then 1 serving per week): processed foods, sweets, sweetened drinks, white starches (rice, flour, bread, potatoes, pasta, etc), red meat, fast foods, butter  *Tip: CHOOSE instead   * 5-9 servings per day of fresh or frozen fruits and vegetables (but not corn, potatoes, bananas, canned or dried fruit)   *nuts and seeds, beans   *olives and olive oil   *small portions of lean meats such as fish and white chicken    *small portions of whole grains  3)Get at least 150 minutes of sweaty aerobic exercise per week.  4)Reduce stress - consider counseling, meditation and relaxation to balance other aspects of your life.

## 2017-01-21 NOTE — Progress Notes (Signed)
Pre visit review using our clinic review tool, if applicable. No additional management support is needed unless otherwise documented below in the visit note. 

## 2017-05-14 DIAGNOSIS — D485 Neoplasm of uncertain behavior of skin: Secondary | ICD-10-CM | POA: Diagnosis not present

## 2017-05-14 DIAGNOSIS — D225 Melanocytic nevi of trunk: Secondary | ICD-10-CM | POA: Diagnosis not present

## 2017-05-14 DIAGNOSIS — Z86018 Personal history of other benign neoplasm: Secondary | ICD-10-CM | POA: Diagnosis not present

## 2017-05-14 DIAGNOSIS — L821 Other seborrheic keratosis: Secondary | ICD-10-CM | POA: Diagnosis not present

## 2017-05-14 DIAGNOSIS — L814 Other melanin hyperpigmentation: Secondary | ICD-10-CM | POA: Diagnosis not present

## 2017-05-14 DIAGNOSIS — L988 Other specified disorders of the skin and subcutaneous tissue: Secondary | ICD-10-CM | POA: Diagnosis not present

## 2017-05-14 DIAGNOSIS — Z85828 Personal history of other malignant neoplasm of skin: Secondary | ICD-10-CM | POA: Diagnosis not present

## 2017-05-14 DIAGNOSIS — L570 Actinic keratosis: Secondary | ICD-10-CM | POA: Diagnosis not present

## 2017-05-14 DIAGNOSIS — D1801 Hemangioma of skin and subcutaneous tissue: Secondary | ICD-10-CM | POA: Diagnosis not present

## 2017-06-06 ENCOUNTER — Encounter: Payer: Self-pay | Admitting: Gynecology

## 2017-06-06 DIAGNOSIS — C50912 Malignant neoplasm of unspecified site of left female breast: Secondary | ICD-10-CM | POA: Diagnosis not present

## 2017-06-06 DIAGNOSIS — Z853 Personal history of malignant neoplasm of breast: Secondary | ICD-10-CM | POA: Diagnosis not present

## 2017-06-06 DIAGNOSIS — I771 Stricture of artery: Secondary | ICD-10-CM | POA: Diagnosis not present

## 2017-06-06 DIAGNOSIS — E559 Vitamin D deficiency, unspecified: Secondary | ICD-10-CM | POA: Diagnosis not present

## 2017-06-06 DIAGNOSIS — R922 Inconclusive mammogram: Secondary | ICD-10-CM | POA: Diagnosis not present

## 2017-06-06 DIAGNOSIS — C50411 Malignant neoplasm of upper-outer quadrant of right female breast: Secondary | ICD-10-CM | POA: Diagnosis not present

## 2017-06-10 ENCOUNTER — Encounter: Payer: Managed Care, Other (non HMO) | Admitting: Gynecology

## 2017-06-24 DIAGNOSIS — Z171 Estrogen receptor negative status [ER-]: Secondary | ICD-10-CM | POA: Diagnosis not present

## 2017-06-24 DIAGNOSIS — C50411 Malignant neoplasm of upper-outer quadrant of right female breast: Secondary | ICD-10-CM | POA: Diagnosis not present

## 2017-06-24 DIAGNOSIS — Z853 Personal history of malignant neoplasm of breast: Secondary | ICD-10-CM | POA: Diagnosis not present

## 2017-06-24 DIAGNOSIS — D519 Vitamin B12 deficiency anemia, unspecified: Secondary | ICD-10-CM | POA: Diagnosis not present

## 2017-06-24 DIAGNOSIS — Z7983 Long term (current) use of bisphosphonates: Secondary | ICD-10-CM | POA: Diagnosis not present

## 2017-07-02 ENCOUNTER — Encounter: Payer: Self-pay | Admitting: Gynecology

## 2017-07-02 ENCOUNTER — Ambulatory Visit (INDEPENDENT_AMBULATORY_CARE_PROVIDER_SITE_OTHER): Payer: Medicare Other | Admitting: Gynecology

## 2017-07-02 VITALS — BP 120/78 | Ht 67.0 in | Wt 175.0 lb

## 2017-07-02 DIAGNOSIS — C50911 Malignant neoplasm of unspecified site of right female breast: Secondary | ICD-10-CM

## 2017-07-02 DIAGNOSIS — N952 Postmenopausal atrophic vaginitis: Secondary | ICD-10-CM

## 2017-07-02 DIAGNOSIS — Z01411 Encounter for gynecological examination (general) (routine) with abnormal findings: Secondary | ICD-10-CM

## 2017-07-02 DIAGNOSIS — Z1272 Encounter for screening for malignant neoplasm of vagina: Secondary | ICD-10-CM

## 2017-07-02 DIAGNOSIS — M810 Age-related osteoporosis without current pathological fracture: Secondary | ICD-10-CM

## 2017-07-02 NOTE — Addendum Note (Signed)
Addended by: Nelva Nay on: 07/02/2017 03:10 PM   Modules accepted: Orders

## 2017-07-02 NOTE — Progress Notes (Signed)
    Mackenzie Norton 02-Dec-1950 [id number]        66 y.o.  G0P0 for breast and pelvic exam  Past medical history,surgical history, problem list, medications, allergies, family history and social history were all reviewed and documented as reviewed in the EPIC chart.  ROS:  Performed with pertinent positives and negatives included in the history, assessment and plan.   Additional significant findings :  None   Exam: Mackenzie Norton assistant Vitals:   07/02/17 1413  BP: 120/78  Weight: 175 lb (79.4 kg)  Height: 5\' 7"  (1.702 m)   Body mass index is 27.41 kg/m.  General appearance:  Normal affect, orientation and appearance. Skin: Grossly normal HEENT: Without gross lesions.  No cervical or supraclavicular adenopathy. Thyroid normal.  Lungs:  Clear without wheezing, rales or rhonchi Cardiac: RR, without RMG Abdominal:  Soft, nontender, without masses, guarding, rebound, organomegaly or hernia Breasts:  Examined lying and sitting. Left without masses, retractions, discharge or axillary adenopathy.  Right with healed biopsy scar without masses, retractions, axillary adenopathy Pelvic:  Ext, BUS, Vagina: With atrophic changes. Pap smear of vaginal cuff done  Adnexa: Without masses or tenderness    Anus and perineum: Normal   Rectovaginal: Normal sphincter tone without palpated masses or tenderness.    Assessment/Plan:  66 y.o. G0P0 female for breast and pelvic exam..   1. With history of Lynch syndrome followed for right breast cancer, status post robotic hysterectomy with RSO, history of prior LSO, colonoscopy last year with planned repeat this year. Actively being followed for this. Exam today unremarkable. Pap smear of vaginal cuff done. 2. Mammography 05/2017. Continue with annual mammography and oncology follow up. Exam NED.  3. Pap smear 2015. Pap smear done today. We'll continue with screening given genetic history. 4. Osteoporosis. History of vertebral fracture. Recent DEXA  04/2016 T score -1.8. Currently on Zometa by her other physicians. She'll continue to follow up with them in reference to her bone health. 5. Health maintenance. No routine lab work done as patient does this elsewhere. Follow up 1 year, sooner as needed.   Mackenzie Auerbach MD, 2:54 PM 07/02/2017

## 2017-07-02 NOTE — Patient Instructions (Signed)
Followup in one year for annual exam, sooner as needed. 

## 2017-07-03 DIAGNOSIS — R718 Other abnormality of red blood cells: Secondary | ICD-10-CM | POA: Diagnosis not present

## 2017-07-03 DIAGNOSIS — D539 Nutritional anemia, unspecified: Secondary | ICD-10-CM | POA: Diagnosis not present

## 2017-07-04 LAB — PAP IG (IMAGE GUIDED)

## 2017-07-08 DIAGNOSIS — C50411 Malignant neoplasm of upper-outer quadrant of right female breast: Secondary | ICD-10-CM | POA: Diagnosis not present

## 2017-07-12 DIAGNOSIS — I712 Thoracic aortic aneurysm, without rupture: Secondary | ICD-10-CM | POA: Diagnosis not present

## 2017-07-12 DIAGNOSIS — I348 Other nonrheumatic mitral valve disorders: Secondary | ICD-10-CM | POA: Diagnosis not present

## 2017-07-12 DIAGNOSIS — Z01810 Encounter for preprocedural cardiovascular examination: Secondary | ICD-10-CM | POA: Diagnosis not present

## 2017-07-29 DIAGNOSIS — K640 First degree hemorrhoids: Secondary | ICD-10-CM | POA: Diagnosis not present

## 2017-07-29 DIAGNOSIS — K635 Polyp of colon: Secondary | ICD-10-CM | POA: Diagnosis not present

## 2017-07-29 DIAGNOSIS — Z85038 Personal history of other malignant neoplasm of large intestine: Secondary | ICD-10-CM | POA: Diagnosis not present

## 2017-07-29 DIAGNOSIS — D120 Benign neoplasm of cecum: Secondary | ICD-10-CM | POA: Diagnosis not present

## 2017-07-29 DIAGNOSIS — D123 Benign neoplasm of transverse colon: Secondary | ICD-10-CM | POA: Diagnosis not present

## 2017-07-29 DIAGNOSIS — Z86010 Personal history of colonic polyps: Secondary | ICD-10-CM | POA: Diagnosis not present

## 2017-07-29 DIAGNOSIS — K5730 Diverticulosis of large intestine without perforation or abscess without bleeding: Secondary | ICD-10-CM | POA: Diagnosis not present

## 2017-07-29 LAB — HM COLONOSCOPY

## 2017-08-08 ENCOUNTER — Encounter: Payer: Self-pay | Admitting: Family Medicine

## 2017-09-09 DIAGNOSIS — Z23 Encounter for immunization: Secondary | ICD-10-CM | POA: Diagnosis not present

## 2017-10-02 DIAGNOSIS — S99921A Unspecified injury of right foot, initial encounter: Secondary | ICD-10-CM | POA: Diagnosis not present

## 2017-10-02 DIAGNOSIS — S99911A Unspecified injury of right ankle, initial encounter: Secondary | ICD-10-CM | POA: Diagnosis not present

## 2017-10-02 DIAGNOSIS — S92351A Displaced fracture of fifth metatarsal bone, right foot, initial encounter for closed fracture: Secondary | ICD-10-CM | POA: Diagnosis not present

## 2017-10-03 DIAGNOSIS — S92354A Nondisplaced fracture of fifth metatarsal bone, right foot, initial encounter for closed fracture: Secondary | ICD-10-CM | POA: Diagnosis not present

## 2017-10-17 DIAGNOSIS — S92354D Nondisplaced fracture of fifth metatarsal bone, right foot, subsequent encounter for fracture with routine healing: Secondary | ICD-10-CM | POA: Diagnosis not present

## 2017-11-21 DIAGNOSIS — S92354D Nondisplaced fracture of fifth metatarsal bone, right foot, subsequent encounter for fracture with routine healing: Secondary | ICD-10-CM | POA: Diagnosis not present

## 2017-11-28 ENCOUNTER — Encounter: Payer: Self-pay | Admitting: Family Medicine

## 2018-01-02 DIAGNOSIS — Z08 Encounter for follow-up examination after completed treatment for malignant neoplasm: Secondary | ICD-10-CM | POA: Diagnosis not present

## 2018-01-02 DIAGNOSIS — R922 Inconclusive mammogram: Secondary | ICD-10-CM | POA: Diagnosis not present

## 2018-01-02 DIAGNOSIS — E559 Vitamin D deficiency, unspecified: Secondary | ICD-10-CM | POA: Diagnosis not present

## 2018-01-02 DIAGNOSIS — C50411 Malignant neoplasm of upper-outer quadrant of right female breast: Secondary | ICD-10-CM | POA: Diagnosis not present

## 2018-01-02 DIAGNOSIS — Z853 Personal history of malignant neoplasm of breast: Secondary | ICD-10-CM | POA: Diagnosis not present

## 2018-01-03 DIAGNOSIS — E559 Vitamin D deficiency, unspecified: Secondary | ICD-10-CM | POA: Diagnosis not present

## 2018-01-03 DIAGNOSIS — C50411 Malignant neoplasm of upper-outer quadrant of right female breast: Secondary | ICD-10-CM | POA: Diagnosis not present

## 2018-01-03 DIAGNOSIS — Z171 Estrogen receptor negative status [ER-]: Secondary | ICD-10-CM | POA: Diagnosis not present

## 2018-01-03 DIAGNOSIS — Z7983 Long term (current) use of bisphosphonates: Secondary | ICD-10-CM | POA: Diagnosis not present

## 2018-01-24 ENCOUNTER — Encounter: Payer: Medicare Other | Admitting: Family Medicine

## 2018-02-14 DIAGNOSIS — S92354D Nondisplaced fracture of fifth metatarsal bone, right foot, subsequent encounter for fracture with routine healing: Secondary | ICD-10-CM | POA: Diagnosis not present

## 2018-03-01 NOTE — Progress Notes (Signed)
Medicare Annual Preventive Care Visit  (initial annual wellness or annual wellness exam)  Concerns and/or follow up today: ? Due for ros, colon?, labs - she reports she gets her colonoscopy every year in September and is UTD. Fasting for labs. Sees gyn and dermatology for regular exams. Sees specialist for dilated aorta - reports is not and aneurysm, but almost. Reports specialist is watching this.  New concern of cough for 1 week: -started with low grade fever, cough, congestion -cough as persisted -denies persistent fevers, hemoptysis, body aches, NV  PMH significant for Breast Ca, Lynch Syndrome, PMS2 gene mutation (Sees oncology, Dr. Phillip Heal - [facility], GI, gynecologist, dermatologist for this). [name] S/p total hysterectomy, R salpingo-oophorectomy in 2017. Sees GI for colonoscopies. Sees dermatologist for regular skin checks Sees Dr. Phineas Real in gyn for gyn/breast exams.  See HM section in Epic for other details of completed HM. See scanned documentation under Media Tab for further documentation HPI, health risk assessment. See Media Tab and Care Teams sections in Epic for other providers.  ROS: negative for report of fevers, unintentional weight loss, vision changes, vision loss, hearing loss or change, chest pain, sob, hemoptysis, melena, hematochezia, hematuria, genital discharge or lesions, falls, bleeding or bruising, loc, thoughts of suicide or self harm, memory loss  1.) Patient-completed health risk assessment  - completed and reviewed, see scanned documentation  2.) Review of Medical History: -PMH, PSH, Family History and current specialty and care providers reviewed and updated and listed below  - see scanned in document in chart and below  Past Medical History:  Diagnosis Date  . Allergic rhinitis   . Anemia   . Cancer [facility])    Breast cancer  . Diverticulosis of colon   . Diverticulosis of large intestine 09/17/2007   Qualifier:  Diagnosis of  By: Arnoldo Morale MD, Balinda Quails   . Family history of breast cancer   . Lynch syndrome    annaul colonoscopy, GI survailence, annual urinalysis and gyn management advised  . Osteopenia 06/2014   T score -1.5 FRAX 14%/1.4%  . Ovarian cyst    Left and Right  . Pectus excavatum   . Sarcoidosis    ? of this on skin bx with dermatologist, no symptoms, never symptomatic - sees Dr. Tonia Brooms  . Vertebral compression fracture (HCC)    L-6    Past Surgical History:  Procedure Laterality Date  . ABDOMINAL HYSTERECTOMY  06/08/2016   [facility]-Dr Nancy Marus per patient  . BREAST SURGERY     Biopsy  . COLONOSCOPY  12/17/05  . mole removal and skin cancer surgery at [place]  . OOPHORECTOMY  1990   LSO and RSO  . ROBOTIC ASSISTED TOTAL HYSTERECTOMY  2017   with R salpingo-oophorectomy for Lynch Syndrome  . TONSILLECTOMY    . WRIST SURGERY      Social History   Socioeconomic History  . Marital status: Widowed    Spouse name: Not on file  . Number of children: Not on file  . Years of education: Not on file  . Highest education level: Not on file  Occupational History  . Not on file  Social Needs  . Financial resource strain: Not on file  . Food insecurity:    Worry: Not on file    Inability: Not on file  . Transportation needs:    Medical: Not on file    Non-medical: Not on file  Tobacco Use  .  Smoking status: Former Smoker    Packs/day: 1.00    Years: 14.00    Pack years: 14.00    Types: Cigarettes    Last attempt to quit: 08/19/1980    Years since quitting: 37.5  . Smokeless tobacco: Never Used  Substance and Sexual Activity  . Alcohol use: Yes    Comment: Rare  . Drug use: No  . Sexual activity: Never    Birth control/protection: Post-menopausal    Comment: 1st intercourse 67 yo-Fewer than 5 partners  Lifestyle  . Physical activity:    Days per week: Not on file    Minutes per session: Not on file  . Stress: Not on file  Relationships  . Social  connections:    Talks on phone: Not on file    Gets together: Not on file    Attends religious service: Not on file    Active member of club or organization: Not on file    Attends meetings of clubs or organizations: Not on file    Relationship status: Not on file  . Intimate partner violence:    Fear of current or ex partner: Not on file    Emotionally abused: Not on file    Physically abused: Not on file    Forced sexual activity: Not on file  Other Topics Concern  . 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 Arthritis Mother   . Parkinsonism Mother   . Leukemia Father 17       CLL  . Breast cancer Maternal Grandmother        Age 26  . Heart disease Maternal Grandmother   . Heart disease Maternal Grandfather   . Breast cancer Paternal Grandmother        Age 77  . Cancer Paternal Grandfather        Esophageal cancer  . Breast cancer Paternal Aunt 8  . Thyroid cancer Paternal Aunt 70  . Heart disease Paternal Uncle   . Multiple sclerosis Maternal Uncle   . Lung cancer Paternal Aunt        smoker    Current Outpatient Medications on File Prior to Visit  Medication Sig Dispense Refill  . aspirin 81 MG tablet Take 81 mg by mouth daily.    . Biotin 10 MG TABS Take by mouth daily.      . Calcium-Vitamin D 600-200 MG-UNIT per tablet Take 1 tablet by mouth 2 (two) times daily.      . Cholecalciferol (VITAMIN D3) 1000 UNITS CAPS Take 2,000 Units by mouth daily.      Marland Kitchen CRANBERRY EXTRACT PO Take 300 mg by mouth.    . fexofenadine (ALLEGRA) 30 MG tablet Take 30 mg by mouth as needed.     . fish oil-omega-3 fatty acids 1000 MG capsule Take 1 g by mouth daily.      . Glucosamine-Chondroit-Vit  C-Mn (GLUCOSAMINE CHONDR 500 COMPLEX PO) Take by mouth.      Marland Kitchen L-Methylfolate-Algae-B12-B6 (METANX PO) Take by mouth.    . Magnesium (ESSENTIAL MAGNESIUM) 250 MG TABS Take by mouth daily.      Marland Kitchen  NON FORMULARY Digestive enzyme    . Nutritional Supplements (JUICE PLUS FIBRE PO) Take by mouth daily.      Marland Kitchen OVER THE COUNTER MEDICATION Allergy eye drops    . Zoledronic Acid (ZOMETA IV) Inject into the vein. Given by Dr Phillip Heal (oncologilist)     No current facility-administered medications on file prior to visit.      3.) Review of functional ability and level of safety:  Any difficulty hearing?  See scanned documentation  History of falling?  See scanned documentation  Any trouble with IADLs - using a phone, using transportation, grocery shopping, preparing meals, doing housework, doing laundry, taking medications and managing money?  See scanned documentation  Advance Directives?  reports done, and current, no changes desired   See summary of recommendations in Patient Instructions below.  4.) Physical Exam Vitals:   03/03/18 0924  BP: 120/82  Pulse: 80  Temp: 98 F (36.7 C)   Estimated body mass index is 26.02 kg/m as calculated from the following:   Height as of this encounter: 5' 7.75" (1.721 m).   Weight as of this encounter: 169 lb 14.4 oz (77.1 kg).  EKG (optional): deferred  General: alert, appear well hydrated and in no acute distress  HEENT: visual acuity grossly intact, normal appearance of ear canals and TMs, clear nasal congestion, mild post oropharyngeal erythema with PND, no tonsillar edema or exudate, no sinus TTP  CV: HRRR  Lungs: CTA bilaterally, ? Rhonchi R base  Psych: pleasant and cooperative, no obvious depression or anxiety  Cognitive function grossly intact  See patient instructions for recommendations.  Education and counseling regarding the above review of health provided with a plan for the following: -see scanned patient completed form  for further details -fall prevention strategies discussed  -healthy lifestyle discussed -importance and resources for completing advanced directives discussed -see patient instructions below for any other recommendations provided  4)The following written screening schedule of preventive measures were reviewed with assessment and plan made per below, orders and patient instructions:      Alcohol screening done     Obesity Screening and counseling done     STI screening (Hep C if born [birth year]) offered and per pt wishes     Tobacco Screening done       Pneumococcal (PPSV23 -one dose after 64, one before if risk factors), influenza yearly and hepatitis B vaccines (if high risk - end stage renal disease, IV drugs, homosexual men, live in home for mentally retarded, hemophilia receiving factors) ASSESSMENT/PLAN: done if applicable      Screening mammograph (yearly if >40) ASSESSMENT/PLAN: utd or ordered      Screening Pap smear/pelvic exam (q2 years) ASSESSMENT/PLAN: n/a, declined      Colorectal cancer screening (FOBT yearly or flex sig q4y or colonoscopy q10y or barium enema q4y) ASSESSMENT/PLAN: utd, does yearly with her gastroenterologist in september      Diabetes outpatient self-management training services ASSESSMENT/PLAN: utd or done      Bone mass measurements(covered q2y if indicated - estrogen def, osteoporosis, hyperparathyroid, vertebral abnormalities, osteoporosis or steroids) ASSESSMENT/PLAN: utd - reports does with her oncologist      Screening for glaucoma(q1y if high risk - diabetes, FH, AA and > 50 or hispanic and > 65) ASSESSMENT/PLAN: utd or advised      Medical nutritional therapy for individuals with diabetes or renal disease ASSESSMENT/PLAN: see orders      Cardiovascular screening blood tests (lipids q5y) ASSESSMENT/PLAN: see orders and labs  Diabetes screening tests ASSESSMENT/PLAN: see orders and labs   7.) Summary:   Medicare annual wellness  visit, subsequent - Plan: Lipid panel -risk factors and conditions per above assessment were discussed and treatment, recommendations and referrals were offered per documentation above and orders and patient instructions.  Respiratory illness -? CAP, CXR pending  -tessalon for cough per her request after discussion risks -doxy if worsening or if CAP on xray after discussion risks/uses/options -return percuations  Screening for depression -neg  Lynch syndrome -sees multiple specialist for management  Hyperglycemia - Plan: Hemoglobin A1c    Patient Instructions   BEFORE YOU LEAVE: -xray sheet -Wendie Simmer - update care teams in [place] from [place] - put all providers in care teams in Epic please -obtain and update colonoscopy form her gastroenterologist -labs -follow up: 6 months  Go get the chest xray. I sent the doxy to the pharmacy to take if xray abnormal or if worsening instead of improving if xray normal.   I sent tessalon for the cough.  Follow up if worsening or not improving or new concerns.  We have ordered labs or studies at this visit. It can take up to 1-2 weeks for results and processing. IF results require follow up or explanation, we will call you with instructions. Clinically stable results will be released to your [facility]. If you have not heard from [place] or cannot find your results in [facility] in 2 weeks please contact our office at [phone].  If you are not yet signed up for [facility], please consider signing up.   Ms. Wellen , Thank you for taking time to come for your Medicare Wellness Visit. I appreciate your ongoing commitment to your health goals. Please review the following plan we discussed and let me know if I can assist you in the future.   These are the goals we discussed: Goals    Continue to work on healthy diet and regular exercise      This is a list of the screening recommended for you and due dates:  Health Maintenance  Topic Date Due  .  Colon Cancer Screening  Assistant to update from your specialist  . Mammogram  06/06/2018  . Flu Shot  06/19/2018  . Tetanus Vaccine  01/12/2024  . DEXA scan (bone density measurement)  Completed  .  Hepatitis C: One time screening is recommended by Center for Disease Control  (CDC) for  adults born from [birth year] through 1966.   Completed  . Pneumonia vaccines  Completed           Lucretia Kern, DO

## 2018-03-03 ENCOUNTER — Ambulatory Visit (INDEPENDENT_AMBULATORY_CARE_PROVIDER_SITE_OTHER)
Admission: RE | Admit: 2018-03-03 | Discharge: 2018-03-03 | Disposition: A | Discharge status: Home / Self Care | Payer: Medicare Other | Source: Ambulatory Visit | Attending: Family Medicine | Admitting: Family Medicine

## 2018-03-03 ENCOUNTER — Encounter: Payer: Self-pay | Admitting: Family Medicine

## 2018-03-03 ENCOUNTER — Ambulatory Visit (INDEPENDENT_AMBULATORY_CARE_PROVIDER_SITE_OTHER): Payer: Medicare Other | Admitting: Family Medicine

## 2018-03-03 VITALS — BP 120/82 | HR 80 | Temp 98.0°F | Ht 67.75 in | Wt 169.9 lb

## 2018-03-03 DIAGNOSIS — Z0000 Encounter for general adult medical examination without abnormal findings: Secondary | ICD-10-CM | POA: Diagnosis not present

## 2018-03-03 DIAGNOSIS — R059 Cough, unspecified: Secondary | ICD-10-CM

## 2018-03-03 DIAGNOSIS — J989 Respiratory disorder, unspecified: Secondary | ICD-10-CM

## 2018-03-03 DIAGNOSIS — Z1331 Encounter for screening for depression: Secondary | ICD-10-CM

## 2018-03-03 DIAGNOSIS — R739 Hyperglycemia, unspecified: Secondary | ICD-10-CM

## 2018-03-03 DIAGNOSIS — Z1509 Genetic susceptibility to other malignant neoplasm: Secondary | ICD-10-CM

## 2018-03-03 DIAGNOSIS — R05 Cough: Secondary | ICD-10-CM | POA: Diagnosis not present

## 2018-03-03 LAB — LIPID PANEL
CHOL/HDL RATIO: 3
Cholesterol: 143 mg/dL (ref 0–200)
HDL: 53.9 mg/dL (ref >39.00)
LDL CALC: 67 mg/dL (ref 0–99)
NONHDL: 89.37
Triglycerides: 112 mg/dL (ref 0.0–149.0)
VLDL: 22.4 mg/dL (ref 0.0–40.0)

## 2018-03-03 LAB — HEMOGLOBIN A1C: Hgb A1c MFr Bld: 5.8 % (ref 4.6–6.5)

## 2018-03-03 MED ORDER — DOXYCYCLINE HYCLATE 100 MG PO TABS: 100.0000 mg | ORAL_TABLET | Freq: Two times a day (BID) | ORAL | 0 refills | Status: DC

## 2018-03-03 MED ORDER — BENZONATATE 100 MG PO CAPS: 100.0000 mg | ORAL_CAPSULE | Freq: Three times a day (TID) | ORAL | 0 refills | Status: DC | PRN

## 2018-03-03 NOTE — Addendum Note (Signed)
Addended by: Agnes Lawrence on: 03/03/2018 11:20 AM   Modules accepted: Orders

## 2018-03-03 NOTE — Patient Instructions (Signed)
BEFORE YOU LEAVE: -xray sheet -Wendie Simmer - update care teams in Epic from [place] - put all providers in care teams in Epic please -obtain and update colonoscopy form her gastroenterologist -labs -follow up: 6 months  Go get the chest xray. I sent the doxy to the pharmacy to take if xray abnormal or if worsening instead of improving if xray normal.   I sent tessalon for the cough.  Follow up if worsening or not improving or new concerns.  We have ordered labs or studies at this visit. It can take up to 1-2 weeks for results and processing. IF results require follow up or explanation, we will call you with instructions. Clinically stable results will be released to your [facility]. If you have not heard from [place] or cannot find your results in [facility] in 2 weeks please contact our office at [phone].  If you are not yet signed up for [facility], please consider signing up.   Ms. Donson , Thank you for taking time to come for your Medicare Wellness Visit. I appreciate your ongoing commitment to your health goals. Please review the following plan we discussed and let me know if I can assist you in the future.   These are the goals we discussed: Goals    Continue to work on healthy diet and regular exercise      This is a list of the screening recommended for you and due dates:  Health Maintenance  Topic Date Due  . Colon Cancer Screening  Assistant to update from your specialist  . Mammogram  06/06/2018  . Flu Shot  06/19/2018  . Tetanus Vaccine  01/12/2024  . DEXA scan (bone density measurement)  Completed  .  Hepatitis C: One time screening is recommended by Center for Disease Control  (CDC) for  adults born from [birth year] through 1966.   Completed  . Pneumonia vaccines  Completed

## 2018-05-06 DIAGNOSIS — H2513 Age-related nuclear cataract, bilateral: Secondary | ICD-10-CM | POA: Diagnosis not present

## 2018-05-06 DIAGNOSIS — H04123 Dry eye syndrome of bilateral lacrimal glands: Secondary | ICD-10-CM | POA: Diagnosis not present

## 2018-05-27 DIAGNOSIS — H04121 Dry eye syndrome of right lacrimal gland: Secondary | ICD-10-CM | POA: Diagnosis not present

## 2018-06-05 IMAGING — DX DG CHEST 2V
2 series · 2 of 2 positions shown · non-contrast
Comparison: Chest CT January 09, 2006

CLINICAL DATA: Cough for 1 week

EXAM:
CHEST - 2 VIEW

[chest pa]
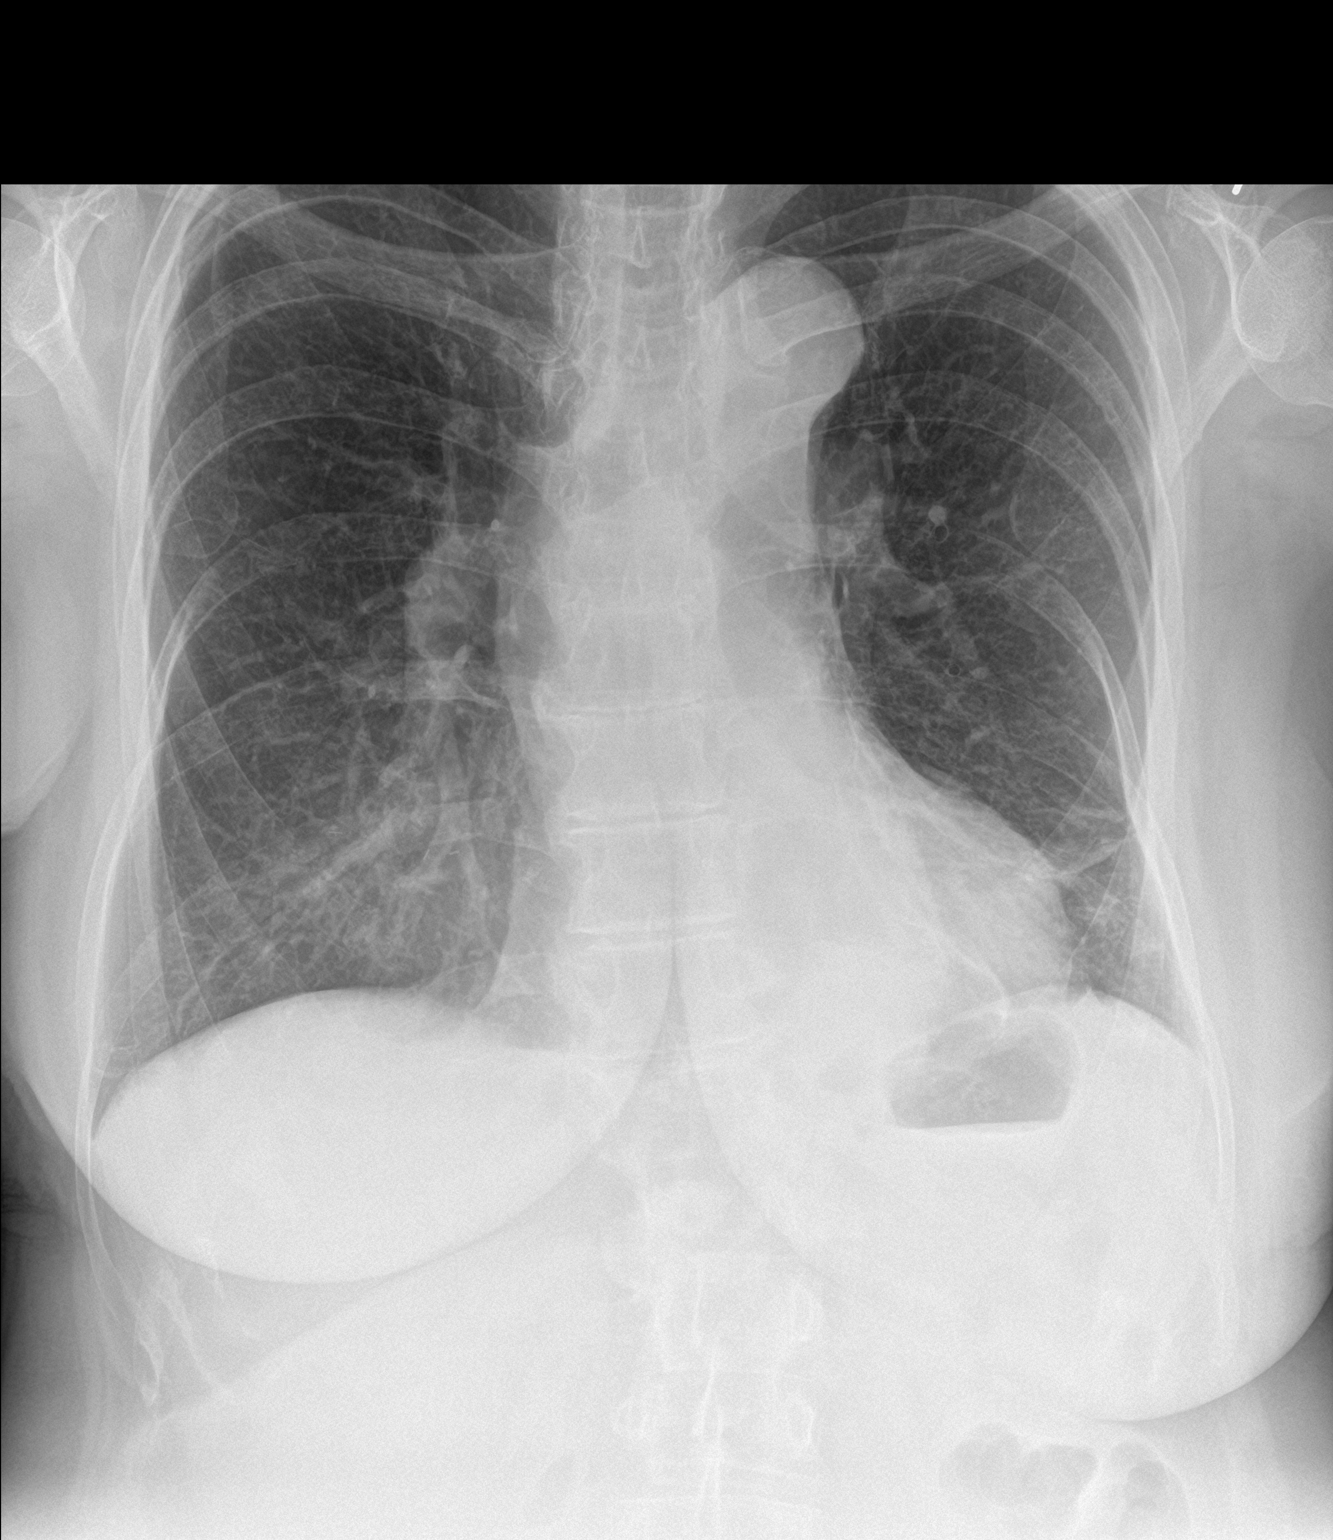

[chest lat]
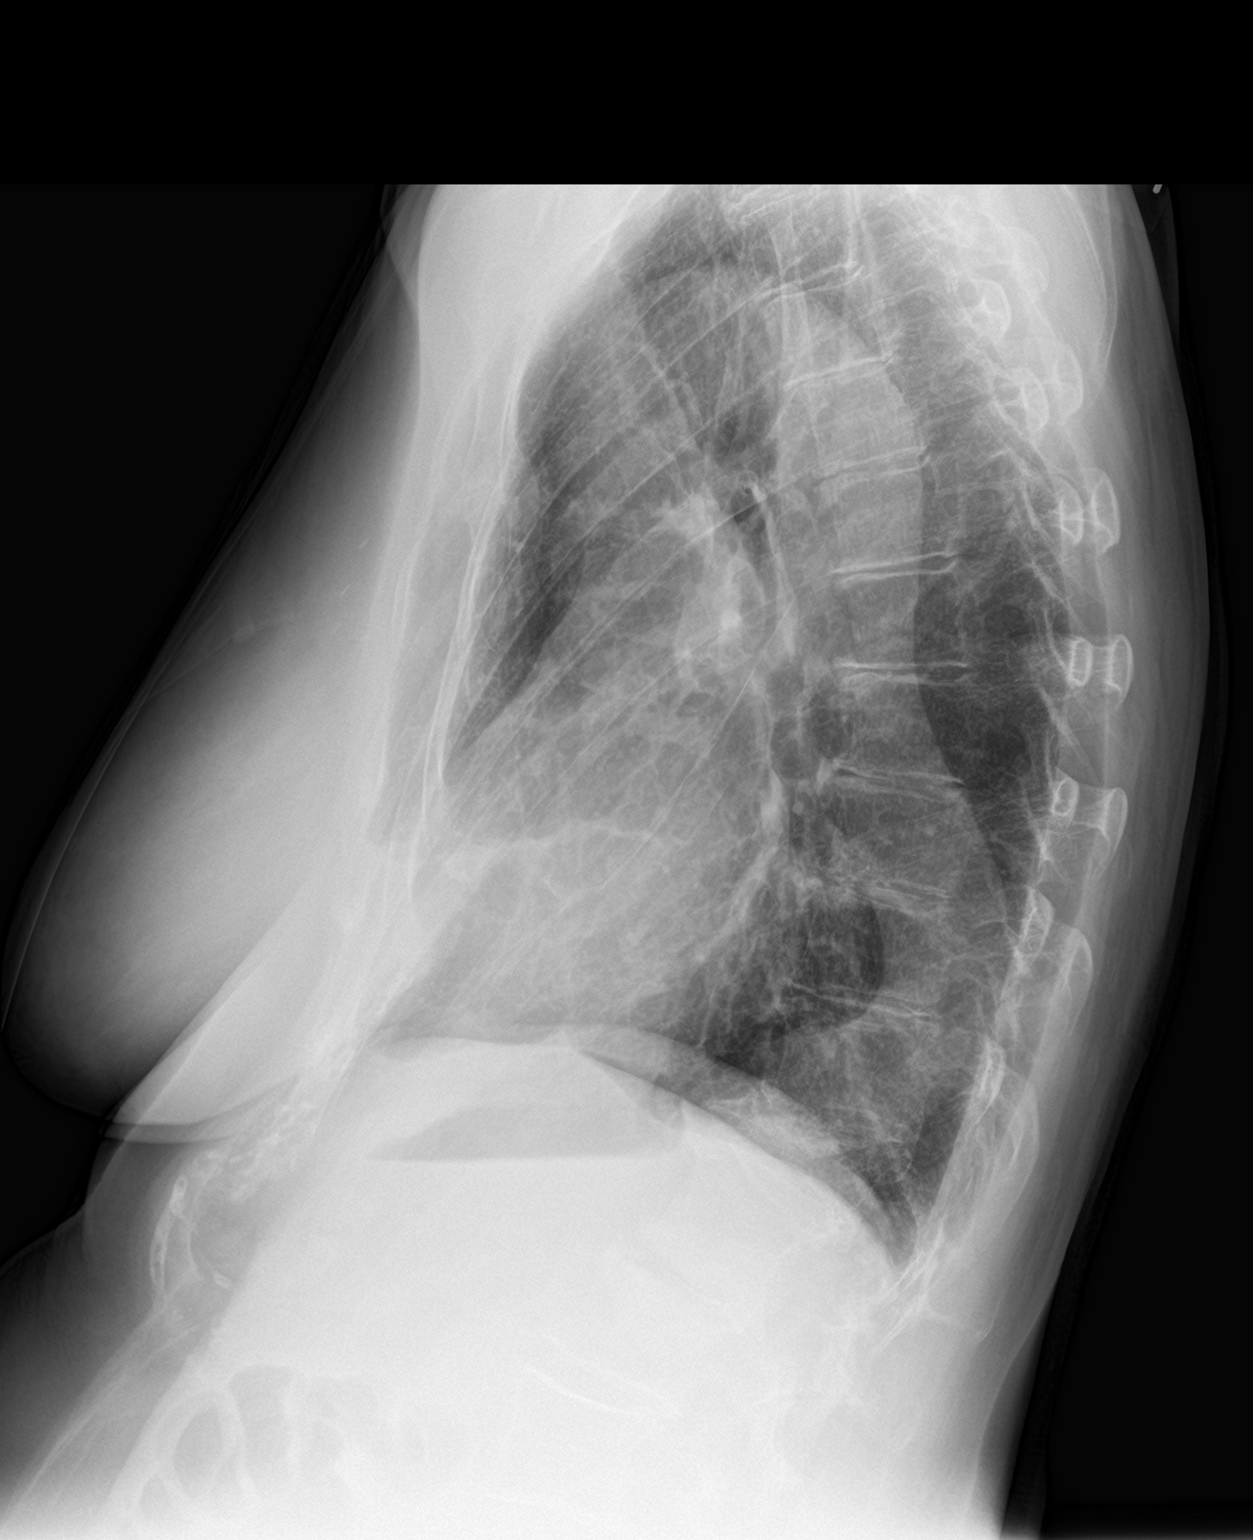

[2 of 2 positions shown; findings below may reference images not displayed]

FINDINGS: There is scarring in the left base. Lungs elsewhere are clear. Heart
size and pulmonary vascularity are normal. No adenopathy. Aorta is
tortuous. No adenopathy. There is pectus excavatum.
IMPRESSION: Scarring left base. No edema or consolidation. Stable aortic
tortuosity. Pectus excavatum.

## 2018-06-09 DIAGNOSIS — Z13820 Encounter for screening for osteoporosis: Secondary | ICD-10-CM | POA: Diagnosis not present

## 2018-06-09 DIAGNOSIS — Z853 Personal history of malignant neoplasm of breast: Secondary | ICD-10-CM | POA: Diagnosis not present

## 2018-06-09 DIAGNOSIS — Z08 Encounter for follow-up examination after completed treatment for malignant neoplasm: Secondary | ICD-10-CM | POA: Diagnosis not present

## 2018-06-09 DIAGNOSIS — E559 Vitamin D deficiency, unspecified: Secondary | ICD-10-CM | POA: Diagnosis not present

## 2018-06-09 DIAGNOSIS — Z780 Asymptomatic menopausal state: Secondary | ICD-10-CM | POA: Diagnosis not present

## 2018-06-09 DIAGNOSIS — M8588 Other specified disorders of bone density and structure, other site: Secondary | ICD-10-CM | POA: Diagnosis not present

## 2018-06-09 DIAGNOSIS — R922 Inconclusive mammogram: Secondary | ICD-10-CM | POA: Diagnosis not present

## 2018-06-09 DIAGNOSIS — I771 Stricture of artery: Secondary | ICD-10-CM | POA: Diagnosis not present

## 2018-06-09 LAB — HM DEXA SCAN

## 2018-06-09 LAB — HM MAMMOGRAPHY

## 2018-06-10 DIAGNOSIS — Z7983 Long term (current) use of bisphosphonates: Secondary | ICD-10-CM | POA: Diagnosis not present

## 2018-06-10 DIAGNOSIS — Z171 Estrogen receptor negative status [ER-]: Secondary | ICD-10-CM | POA: Diagnosis not present

## 2018-06-10 DIAGNOSIS — C50411 Malignant neoplasm of upper-outer quadrant of right female breast: Secondary | ICD-10-CM | POA: Diagnosis not present

## 2018-06-17 DIAGNOSIS — L821 Other seborrheic keratosis: Secondary | ICD-10-CM | POA: Diagnosis not present

## 2018-06-17 DIAGNOSIS — Z85828 Personal history of other malignant neoplasm of skin: Secondary | ICD-10-CM | POA: Diagnosis not present

## 2018-06-17 DIAGNOSIS — D225 Melanocytic nevi of trunk: Secondary | ICD-10-CM | POA: Diagnosis not present

## 2018-06-17 DIAGNOSIS — H15101 Unspecified episcleritis, right eye: Secondary | ICD-10-CM | POA: Diagnosis not present

## 2018-06-17 DIAGNOSIS — D1801 Hemangioma of skin and subcutaneous tissue: Secondary | ICD-10-CM | POA: Diagnosis not present

## 2018-06-17 DIAGNOSIS — L814 Other melanin hyperpigmentation: Secondary | ICD-10-CM | POA: Diagnosis not present

## 2018-06-17 DIAGNOSIS — Z86018 Personal history of other benign neoplasm: Secondary | ICD-10-CM | POA: Diagnosis not present

## 2018-06-17 DIAGNOSIS — H04121 Dry eye syndrome of right lacrimal gland: Secondary | ICD-10-CM | POA: Diagnosis not present

## 2018-06-23 ENCOUNTER — Encounter: Payer: Self-pay | Admitting: Gynecology

## 2018-07-08 ENCOUNTER — Ambulatory Visit (INDEPENDENT_AMBULATORY_CARE_PROVIDER_SITE_OTHER): Payer: Medicare Other | Admitting: Gynecology

## 2018-07-08 ENCOUNTER — Encounter: Payer: Self-pay | Admitting: Gynecology

## 2018-07-08 VITALS — BP 124/80 | Ht 68.0 in | Wt 172.0 lb

## 2018-07-08 DIAGNOSIS — Z01419 Encounter for gynecological examination (general) (routine) without abnormal findings: Secondary | ICD-10-CM | POA: Diagnosis not present

## 2018-07-08 DIAGNOSIS — M810 Age-related osteoporosis without current pathological fracture: Secondary | ICD-10-CM

## 2018-07-08 DIAGNOSIS — N952 Postmenopausal atrophic vaginitis: Secondary | ICD-10-CM

## 2018-07-08 DIAGNOSIS — Z853 Personal history of malignant neoplasm of breast: Secondary | ICD-10-CM

## 2018-07-08 DIAGNOSIS — Z9189 Other specified personal risk factors, not elsewhere classified: Secondary | ICD-10-CM | POA: Diagnosis not present

## 2018-07-08 NOTE — Patient Instructions (Signed)
Follow-up in 1 year, sooner if any issues 

## 2018-07-08 NOTE — Progress Notes (Signed)
    Mackenzie Norton 1951-11-12 [id number]        66 y.o.  G0P0 for breast and pelvic exam.  Doing well without gynecologic complaints  Past medical history,surgical history, problem list, medications, allergies, family history and social history were all reviewed and documented as reviewed in the EPIC chart.  ROS:  Performed with pertinent positives and negatives included in the history, assessment and plan.   Additional significant findings : None   Exam: [name] assistant Vitals:   07/08/18 1354  BP: 124/80  Weight: 172 lb (78 kg)  Height: 5\' 8"  (1.727 m)   Body mass index is 26.15 kg/m.  General appearance:  Normal affect, orientation and appearance. Skin: Grossly normal HEENT: Without gross lesions.  No cervical or supraclavicular adenopathy. Thyroid normal.  Lungs:  Clear without wheezing, rales or rhonchi Cardiac: RR, without RMG Abdominal:  Soft, nontender, without masses, guarding, rebound, organomegaly or hernia Breasts:  Examined lying and sitting without masses, retractions, discharge or axillary adenopathy.  Well-healed right biopsy scar Pelvic:  Ext, BUS, Vagina: With atrophic changes.  Adnexa: Without masses or tenderness    Anus and perineum: Normal   Rectovaginal: Normal sphincter tone without palpated masses or tenderness.    Assessment/Plan:  67 y.o. G0P0 female for breast and pelvic exam.   1. Postmenopausal/atrophic genital changes.  Status post robotic hysterectomy with RSO, history of LSO with history of Lynch's syndrome.  Doing well without significant menopausal symptoms. 2. Pap smear 2018.  No Pap smear done today.  No history of significant abnormal Pap smears previously. 3. Colonoscopy 2018.  We will repeat at their recommended interval. 4. Mammography 05/2018.  History of right-sided breast cancer.  Followed by oncology.  Breast exam normal today. 5. Osteoporosis.  History of a vertebral fracture on Zometa by her other physicians.  Most recent  DEXA 06/2018 T score -1.8 distal third of radius.  Continue to follow-up with her other physicians for bone health. 6. Health maintenance.  No routine lab work done as patient does this elsewhere.  Follow-up 1 year, sooner as needed.   [name], 2:49 PM 07/08/2018

## 2018-07-17 ENCOUNTER — Encounter: Payer: Self-pay | Admitting: Family Medicine

## 2018-07-17 DIAGNOSIS — I712 Thoracic aortic aneurysm, without rupture: Secondary | ICD-10-CM | POA: Diagnosis not present

## 2018-08-18 ENCOUNTER — Encounter: Payer: Self-pay | Admitting: Family Medicine

## 2018-08-18 DIAGNOSIS — K5730 Diverticulosis of large intestine without perforation or abscess without bleeding: Secondary | ICD-10-CM | POA: Diagnosis not present

## 2018-08-18 DIAGNOSIS — K449 Diaphragmatic hernia without obstruction or gangrene: Secondary | ICD-10-CM | POA: Diagnosis not present

## 2018-08-18 DIAGNOSIS — K440 Diaphragmatic hernia with obstruction, without gangrene: Secondary | ICD-10-CM | POA: Diagnosis not present

## 2018-08-18 DIAGNOSIS — Z85038 Personal history of other malignant neoplasm of large intestine: Secondary | ICD-10-CM | POA: Diagnosis not present

## 2018-08-18 DIAGNOSIS — K635 Polyp of colon: Secondary | ICD-10-CM | POA: Diagnosis not present

## 2018-08-18 DIAGNOSIS — K922 Gastrointestinal hemorrhage, unspecified: Secondary | ICD-10-CM | POA: Diagnosis not present

## 2018-08-18 DIAGNOSIS — D122 Benign neoplasm of ascending colon: Secondary | ICD-10-CM | POA: Diagnosis not present

## 2018-08-18 DIAGNOSIS — Z1509 Genetic susceptibility to other malignant neoplasm: Secondary | ICD-10-CM | POA: Diagnosis not present

## 2018-08-18 DIAGNOSIS — Z86010 Personal history of colonic polyps: Secondary | ICD-10-CM | POA: Diagnosis not present

## 2018-08-18 DIAGNOSIS — K640 First degree hemorrhoids: Secondary | ICD-10-CM | POA: Diagnosis not present

## 2018-08-18 LAB — HM COLONOSCOPY

## 2018-09-01 NOTE — Progress Notes (Signed)
HPI:  Using dictation device. Unfortunately this device frequently misinterprets words/phrases.  Mackenzie Norton is a pleasant 67 y.o. here for follow up. Chronic medical problems summarized below were reviewed for changes and stability and were updated as needed below. These issues and their treatment remain stable for the most part.  Reports she is doing well.  She saw her oncologist, told her she was doing quite well.  She reports he told her that if she makes it 3 years without cancer that it is likely she has the same cancer risk as the general population.  She was very happy about this.  She had her colonoscopy with her gastroenterologist, Dr. Darrick Grinder, in September and it looked good.  She is seeing Dr. Alva Garnet at [facility] about her aortic aneurysm.  She has an MR scheduled in November to reevaluate this.  She was placed on a low-dose of losartan by her cardiologist. Denies CP, SOB, DOE, treatment intolerance or new symptoms.  She is eating healthy and getting regular exercise. She request her shingles vaccine and flu vaccine today.  AWV 03/03/18   Lynch Syndrome/PMS2 gene mutation/Hx breast Ca: -sees Dr. Leona Singleton -oncologist, [facility] -sees GI (Dr. Darrick Grinder), dermatology and gyn (Dr. Phineas Real) specialists - most are in [place] -S/p total hysterectomy, R SO in 2017 -gets colonoscopy yearly -on zometa  Hx Dilated Aorta: -sees vascular specialist for monitoring/management (Dr. Alva Garnet) -specialist put her on Losartan  Seasonal allergies: -meds: allegra  ROS: See pertinent positives and negatives per HPI.  Past Medical History:  Diagnosis Date  . Allergic rhinitis   . Anemia   . Cancer [facility])    Breast cancer  . Diverticulosis of colon   . Diverticulosis of large intestine 09/17/2007   Qualifier: Diagnosis of  By: Arnoldo Morale MD, Devona Konig syndrome    annaul colonoscopy, GI survailence, annual urinalysis and gyn management advised  . Osteopenia 06/2014   T score -1.5 FRAX 14%/1.4%   . Ovarian cyst    Left and Right  . Pectus excavatum   . Sarcoidosis    ? of this on skin bx with dermatologist, no symptoms, never symptomatic - sees Dr. Tonia Brooms  . Vertebral compression fracture (HCC)    L-6    Past Surgical History:  Procedure Laterality Date  . ABDOMINAL HYSTERECTOMY  06/08/2016   [facility]-Dr Nancy Marus per patient  . BREAST SURGERY     Biopsy,Lumpectomy  . COLONOSCOPY  12/17/05  . mole removal and skin cancer surgery at [place]  . OOPHORECTOMY  1990   LSO and RSO  . ROBOTIC ASSISTED TOTAL HYSTERECTOMY  2017   with R salpingo-oophorectomy for Lynch Syndrome  . TONSILLECTOMY    . WRIST SURGERY      Family History  Problem Relation Age of Onset  . Arthritis Mother   . Parkinsonism Mother   . Leukemia Father 15       CLL  . Breast cancer Maternal Grandmother        Age 31  . Heart disease Maternal Grandmother   . Heart disease Maternal Grandfather   . Breast cancer Paternal Grandmother        Age 4  . Cancer Paternal Grandfather        Esophageal cancer  . Breast cancer Paternal Aunt 49  . Thyroid cancer Paternal Aunt 62  . Heart disease Paternal Uncle   . Multiple sclerosis Maternal Uncle   . Lung cancer Paternal Aunt  smoker    SOCIAL HX:se hpi   Current Outpatient Medications:  .  aspirin 81 MG tablet, Take 81 mg by mouth daily., Disp: , Rfl:  .  Biotin 10 MG TABS, Take by mouth daily.  , Disp: , Rfl:  .  Calcium-Vitamin D 600-200 MG-UNIT per tablet, Take 1 tablet by mouth 2 (two) times daily.  , Disp: , Rfl:  .  Cholecalciferol (VITAMIN D3) 1000 UNITS CAPS, Take 2,000 Units by mouth daily.  , Disp: , Rfl:  .  CRANBERRY EXTRACT PO, Take 300 mg by mouth., Disp: , Rfl:  .  fexofenadine (ALLEGRA) 30 MG tablet, Take 30 mg by mouth as needed. , Disp: , Rfl:  .  fish oil-omega-3 fatty acids 1000 MG capsule, Take 1 g by mouth daily.  , Disp: , Rfl:  .  Glucosamine-Chondroit-Vit C-Mn (GLUCOSAMINE CHONDR 500 COMPLEX PO), Take by  mouth.  , Disp: , Rfl:  .  L-Methylfolate-Algae-B12-B6 (METANX PO), Take by mouth., Disp: , Rfl:  .  losartan (COZAAR) 25 MG tablet, TK 1 T PO  D, Disp: , Rfl: 11 .  Magnesium (ESSENTIAL MAGNESIUM) 250 MG TABS, Take by mouth daily.  , Disp: , Rfl:  .  NON FORMULARY, Digestive enzyme, Disp: , Rfl:  .  Nutritional Supplements (JUICE PLUS FIBRE PO), Take by mouth daily.  , Disp: , Rfl:  .  OVER THE COUNTER MEDICATION, Allergy eye drops, Disp: , Rfl:  .  Zoledronic Acid (ZOMETA IV), Inject into the vein. Given by Dr Phillip Heal (oncologilist), Disp: , Rfl:   EXAM:  Vitals:   09/02/18 0850  BP: 110/74  Pulse: 81  Temp: 97.9 F (36.6 C)    Body mass index is 26.67 kg/m.  GENERAL: vitals reviewed and listed above, alert, oriented, appears well hydrated and in no acute distress  HEENT: atraumatic, conjunttiva clear, no obvious abnormalities on inspection of external nose and ears  NECK: no obvious masses on inspection  LUNGS: clear to auscultation bilaterally, no wheezes, rales or rhonchi, good air movement  CV: HRRR, no peripheral edema  MS: moves all extremities without noticeable abnormality  PSYCH: pleasant and cooperative, no obvious depression or anxiety  ASSESSMENT AND PLAN:  Discussed the following assessment and plan:  Vaccine counseling  Abdominal aortic aneurysm (AAA) without rupture ([place]), Chronic  Malignant neoplasm of right female breast, unspecified estrogen receptor status, unspecified site of breast ([place]), Chronic  Lynch syndrome  -doing well -reviewed report from GI - updated HM -BP good today -vaccine counseling, she wants to get the flu vaccine and shingrix -AWV in 6 months -healthy diet and exercise advised per CV specialist -follow up as needed in interim  Patient Instructions  BEFORE YOU LEAVE: -flu shot, shingrix -follow up: 6 months for AWV with Dr. Maudie Mercury   We recommend the following healthy lifestyle for LIFE: 1) Small portions. But, make sure  to get regular (at least 3 per day), healthy meals and small healthy snacks if needed.  2) Eat a healthy clean diet.   TRY TO EAT: -at least 5-7 servings of low sugar, colorful, and nutrient rich vegetables per day (not corn, potatoes or bananas.) -berries are the best choice if you wish to eat fruit (only eat small amounts if trying to reduce weight)  -lean meets (fish, white meat of chicken or [place]) -vegan proteins for some meals - beans or tofu, whole grains, nuts and seeds -Replace bad fats with good fats - good fats include: fish, nuts and seeds,  canola oil, olive oil -small amounts of low fat or non fat dairy -small amounts of100 % whole grains - check the lables -drink plenty of water  AVOID: -SUGAR, sweets, anything with added sugar, corn syrup or sweeteners - must read labels as even foods advertised as "healthy" often are loaded with sugar -if you must have a sweetener, small amounts of stevia may be best -sweetened beverages and artificially sweetened beverages -simple starches (rice, bread, potatoes, pasta, chips, etc - small amounts of 100% whole grains are ok) -red meat, pork, butter -fried foods, fast food, processed food, excessive dairy, eggs and coconut.  3)Get at least 150 minutes of sweaty aerobic exercise per week.  4)Reduce stress - consider counseling, meditation and relaxation to balance other aspects of your life.     Lucretia Kern, DO

## 2018-09-02 ENCOUNTER — Encounter: Payer: Self-pay | Admitting: Family Medicine

## 2018-09-02 ENCOUNTER — Ambulatory Visit (INDEPENDENT_AMBULATORY_CARE_PROVIDER_SITE_OTHER): Payer: Medicare Other | Admitting: Family Medicine

## 2018-09-02 VITALS — BP 110/74 | HR 81 | Temp 97.9°F | Ht 68.0 in | Wt 175.4 lb

## 2018-09-02 DIAGNOSIS — Z7185 Encounter for immunization safety counseling: Secondary | ICD-10-CM

## 2018-09-02 DIAGNOSIS — Z23 Encounter for immunization: Secondary | ICD-10-CM

## 2018-09-02 DIAGNOSIS — Z7189 Other specified counseling: Secondary | ICD-10-CM

## 2018-09-02 DIAGNOSIS — Z1509 Genetic susceptibility to other malignant neoplasm: Secondary | ICD-10-CM | POA: Diagnosis not present

## 2018-09-02 DIAGNOSIS — I714 Abdominal aortic aneurysm, without rupture, unspecified: Secondary | ICD-10-CM

## 2018-09-02 DIAGNOSIS — C50911 Malignant neoplasm of unspecified site of right female breast: Secondary | ICD-10-CM

## 2018-09-02 NOTE — Patient Instructions (Signed)
BEFORE YOU LEAVE: -flu shot, shingrix -follow up: 6 months for AWV with Dr. Maudie Mercury   We recommend the following healthy lifestyle for LIFE: 1) Small portions. But, make sure to get regular (at least 3 per day), healthy meals and small healthy snacks if needed.  2) Eat a healthy clean diet.   TRY TO EAT: -at least 5-7 servings of low sugar, colorful, and nutrient rich vegetables per day (not corn, potatoes or bananas.) -berries are the best choice if you wish to eat fruit (only eat small amounts if trying to reduce weight)  -lean meets (fish, white meat of chicken or [place]) -vegan proteins for some meals - beans or tofu, whole grains, nuts and seeds -Replace bad fats with good fats - good fats include: fish, nuts and seeds, canola oil, olive oil -small amounts of low fat or non fat dairy -small amounts of100 % whole grains - check the lables -drink plenty of water  AVOID: -SUGAR, sweets, anything with added sugar, corn syrup or sweeteners - must read labels as even foods advertised as "healthy" often are loaded with sugar -if you must have a sweetener, small amounts of stevia may be best -sweetened beverages and artificially sweetened beverages -simple starches (rice, bread, potatoes, pasta, chips, etc - small amounts of 100% whole grains are ok) -red meat, pork, butter -fried foods, fast food, processed food, excessive dairy, eggs and coconut.  3)Get at least 150 minutes of sweaty aerobic exercise per week.  4)Reduce stress - consider counseling, meditation and relaxation to balance other aspects of your life.

## 2018-09-02 NOTE — Addendum Note (Signed)
Addended by: Agnes Lawrence on: 09/02/2018 09:23 AM   Modules accepted: Orders

## 2018-09-22 DIAGNOSIS — I712 Thoracic aortic aneurysm, without rupture: Secondary | ICD-10-CM | POA: Diagnosis not present

## 2018-09-25 DIAGNOSIS — I712 Thoracic aortic aneurysm, without rupture: Secondary | ICD-10-CM | POA: Diagnosis not present

## 2018-10-01 ENCOUNTER — Encounter: Payer: Self-pay | Admitting: Family Medicine

## 2018-10-24 DIAGNOSIS — R300 Dysuria: Secondary | ICD-10-CM | POA: Diagnosis not present

## 2018-10-24 DIAGNOSIS — R350 Frequency of micturition: Secondary | ICD-10-CM | POA: Diagnosis not present

## 2018-10-25 DIAGNOSIS — R350 Frequency of micturition: Secondary | ICD-10-CM | POA: Diagnosis not present

## 2018-11-05 ENCOUNTER — Encounter: Payer: Self-pay | Admitting: Family Medicine

## 2018-11-19 DIAGNOSIS — M810 Age-related osteoporosis without current pathological fracture: Secondary | ICD-10-CM

## 2018-11-19 HISTORY — DX: Age-related osteoporosis without current pathological fracture: M81.0

## 2018-12-01 ENCOUNTER — Encounter: Payer: Self-pay | Admitting: Gynecology

## 2018-12-01 DIAGNOSIS — Z08 Encounter for follow-up examination after completed treatment for malignant neoplasm: Secondary | ICD-10-CM | POA: Diagnosis not present

## 2018-12-01 DIAGNOSIS — Z853 Personal history of malignant neoplasm of breast: Secondary | ICD-10-CM | POA: Diagnosis not present

## 2018-12-01 DIAGNOSIS — R922 Inconclusive mammogram: Secondary | ICD-10-CM | POA: Diagnosis not present

## 2018-12-02 DIAGNOSIS — Z7983 Long term (current) use of bisphosphonates: Secondary | ICD-10-CM | POA: Diagnosis not present

## 2018-12-02 DIAGNOSIS — Z171 Estrogen receptor negative status [ER-]: Secondary | ICD-10-CM | POA: Diagnosis not present

## 2018-12-02 DIAGNOSIS — C50411 Malignant neoplasm of upper-outer quadrant of right female breast: Secondary | ICD-10-CM | POA: Diagnosis not present

## 2019-02-18 ENCOUNTER — Encounter: Payer: Self-pay | Admitting: Family Medicine

## 2019-03-10 ENCOUNTER — Encounter: Payer: Medicare Other | Admitting: Family Medicine

## 2019-05-11 DIAGNOSIS — E7800 Pure hypercholesterolemia, unspecified: Secondary | ICD-10-CM | POA: Diagnosis not present

## 2019-05-11 DIAGNOSIS — Z79899 Other long term (current) drug therapy: Secondary | ICD-10-CM | POA: Diagnosis not present

## 2019-05-11 DIAGNOSIS — H6123 Impacted cerumen, bilateral: Secondary | ICD-10-CM | POA: Diagnosis not present

## 2019-05-11 DIAGNOSIS — Z853 Personal history of malignant neoplasm of breast: Secondary | ICD-10-CM | POA: Diagnosis not present

## 2019-05-11 DIAGNOSIS — Z923 Personal history of irradiation: Secondary | ICD-10-CM | POA: Diagnosis not present

## 2019-05-11 DIAGNOSIS — Z9221 Personal history of antineoplastic chemotherapy: Secondary | ICD-10-CM | POA: Diagnosis not present

## 2019-05-11 DIAGNOSIS — Z23 Encounter for immunization: Secondary | ICD-10-CM | POA: Diagnosis not present

## 2019-05-12 DIAGNOSIS — H2513 Age-related nuclear cataract, bilateral: Secondary | ICD-10-CM | POA: Diagnosis not present

## 2019-05-12 DIAGNOSIS — H04123 Dry eye syndrome of bilateral lacrimal glands: Secondary | ICD-10-CM | POA: Diagnosis not present

## 2019-06-15 ENCOUNTER — Encounter: Payer: Self-pay | Admitting: Gynecology

## 2019-06-15 DIAGNOSIS — E559 Vitamin D deficiency, unspecified: Secondary | ICD-10-CM | POA: Diagnosis not present

## 2019-06-15 DIAGNOSIS — Z853 Personal history of malignant neoplasm of breast: Secondary | ICD-10-CM | POA: Diagnosis not present

## 2019-06-15 DIAGNOSIS — R921 Mammographic calcification found on diagnostic imaging of breast: Secondary | ICD-10-CM | POA: Diagnosis not present

## 2019-06-15 DIAGNOSIS — K449 Diaphragmatic hernia without obstruction or gangrene: Secondary | ICD-10-CM | POA: Diagnosis not present

## 2019-06-15 DIAGNOSIS — C50411 Malignant neoplasm of upper-outer quadrant of right female breast: Secondary | ICD-10-CM | POA: Diagnosis not present

## 2019-06-15 DIAGNOSIS — R922 Inconclusive mammogram: Secondary | ICD-10-CM | POA: Diagnosis not present

## 2019-06-15 DIAGNOSIS — I771 Stricture of artery: Secondary | ICD-10-CM | POA: Diagnosis not present

## 2019-06-16 DIAGNOSIS — C50411 Malignant neoplasm of upper-outer quadrant of right female breast: Secondary | ICD-10-CM | POA: Diagnosis not present

## 2019-06-16 DIAGNOSIS — M8580 Other specified disorders of bone density and structure, unspecified site: Secondary | ICD-10-CM | POA: Diagnosis not present

## 2019-06-16 DIAGNOSIS — N390 Urinary tract infection, site not specified: Secondary | ICD-10-CM | POA: Diagnosis not present

## 2019-07-06 DIAGNOSIS — D485 Neoplasm of uncertain behavior of skin: Secondary | ICD-10-CM | POA: Diagnosis not present

## 2019-07-06 DIAGNOSIS — Z85828 Personal history of other malignant neoplasm of skin: Secondary | ICD-10-CM | POA: Diagnosis not present

## 2019-07-06 DIAGNOSIS — Z86018 Personal history of other benign neoplasm: Secondary | ICD-10-CM | POA: Diagnosis not present

## 2019-07-06 DIAGNOSIS — L570 Actinic keratosis: Secondary | ICD-10-CM | POA: Diagnosis not present

## 2019-07-06 DIAGNOSIS — C4441 Basal cell carcinoma of skin of scalp and neck: Secondary | ICD-10-CM | POA: Diagnosis not present

## 2019-07-06 DIAGNOSIS — L821 Other seborrheic keratosis: Secondary | ICD-10-CM | POA: Diagnosis not present

## 2019-07-06 DIAGNOSIS — L814 Other melanin hyperpigmentation: Secondary | ICD-10-CM | POA: Diagnosis not present

## 2019-07-06 DIAGNOSIS — L738 Other specified follicular disorders: Secondary | ICD-10-CM | POA: Diagnosis not present

## 2019-07-06 DIAGNOSIS — D1801 Hemangioma of skin and subcutaneous tissue: Secondary | ICD-10-CM | POA: Diagnosis not present

## 2019-07-13 ENCOUNTER — Other Ambulatory Visit: Payer: Self-pay

## 2019-07-14 ENCOUNTER — Ambulatory Visit (INDEPENDENT_AMBULATORY_CARE_PROVIDER_SITE_OTHER): Payer: Medicare Other | Admitting: Gynecology

## 2019-07-14 ENCOUNTER — Encounter: Payer: Self-pay | Admitting: Gynecology

## 2019-07-14 VITALS — BP 122/76 | Ht 68.0 in | Wt 190.0 lb

## 2019-07-14 DIAGNOSIS — Z853 Personal history of malignant neoplasm of breast: Secondary | ICD-10-CM

## 2019-07-14 DIAGNOSIS — Z01419 Encounter for gynecological examination (general) (routine) without abnormal findings: Secondary | ICD-10-CM

## 2019-07-14 DIAGNOSIS — M810 Age-related osteoporosis without current pathological fracture: Secondary | ICD-10-CM

## 2019-07-14 DIAGNOSIS — N952 Postmenopausal atrophic vaginitis: Secondary | ICD-10-CM

## 2019-07-14 NOTE — Patient Instructions (Signed)
Follow-up in 1 year for annual exam, sooner as needed. 

## 2019-07-14 NOTE — Progress Notes (Signed)
    Mackenzie Norton [birth year] [id number]        68 y.o.  G0P0 for breast and pelvic exam.  Without gynecologic complaints  Past medical history,surgical history, problem list, medications, allergies, family history and social history were all reviewed and documented as reviewed in the EPIC chart.  ROS:  Performed with pertinent positives and negatives included in the history, assessment and plan.   Additional significant findings : None   Exam: [name] assistant Vitals:   07/14/19 1104  BP: 122/76  Weight: 190 lb (86.2 kg)  Height: 5\' 8"  (1.727 m)   Body mass index is 28.89 kg/m.  General appearance:  Normal affect, orientation and appearance. Skin: Grossly normal HEENT: Without gross lesions.  No cervical or supraclavicular adenopathy. Thyroid normal.  Lungs:  Clear without wheezing, rales or rhonchi Cardiac: RR, without RMG Abdominal:  Soft, nontender, without masses, guarding, rebound, organomegaly or hernia Breasts:  Examined lying and sitting without masses, retractions, discharge or axillary adenopathy. Pelvic:  Ext, BUS, Vagina: With atrophic changes  Adnexa: Without masses or tenderness    Anus and perineum: Normal   Rectovaginal: Normal sphincter tone without palpated masses or tenderness.    Assessment/Plan:  68 y.o. G0P0 female for breast and pelvic exam.  Status post hysterectomy RSO with prior LSO history.  1. Postmenopausal.  No significant menopausal symptoms. 2. Pap smear 06/2017.  No Pap smear done today.  No history of significant abnormal Pap smears.  Options to stop screening per current screening guidelines reviewed.  Will readdress on an annual basis. 3. History of right breast cancer.  Mammography 05/2019.  Exam NED.  Continue to follow-up with oncology. 4. Osteoporosis.  History of vertebral fracture.  DEXA 2019 T score -1.8 distal third of radius.  On Zometa by her other physicians.  She will continue to follow-up with them in reference to bone  health. 5. Colonoscopy 2019.  Repeat at their recommended interval. 6. Health maintenance.  No routine lab work done as patient does this elsewhere.  Follow-up 1 year, sooner as needed.   [name], 11:46 AM 07/14/2019

## 2019-08-19 DIAGNOSIS — E7800 Pure hypercholesterolemia, unspecified: Secondary | ICD-10-CM | POA: Diagnosis not present

## 2019-08-19 DIAGNOSIS — R300 Dysuria: Secondary | ICD-10-CM | POA: Diagnosis not present

## 2019-08-26 ENCOUNTER — Encounter: Payer: Self-pay | Admitting: Gynecology

## 2019-08-26 DIAGNOSIS — C4441 Basal cell carcinoma of skin of scalp and neck: Secondary | ICD-10-CM | POA: Diagnosis not present

## 2019-09-01 DIAGNOSIS — H04123 Dry eye syndrome of bilateral lacrimal glands: Secondary | ICD-10-CM | POA: Diagnosis not present

## 2019-09-01 DIAGNOSIS — H2513 Age-related nuclear cataract, bilateral: Secondary | ICD-10-CM | POA: Diagnosis not present

## 2019-09-01 DIAGNOSIS — H02846 Edema of left eye, unspecified eyelid: Secondary | ICD-10-CM | POA: Diagnosis not present

## 2019-09-01 DIAGNOSIS — H538 Other visual disturbances: Secondary | ICD-10-CM | POA: Diagnosis not present

## 2019-09-01 DIAGNOSIS — H1045 Other chronic allergic conjunctivitis: Secondary | ICD-10-CM | POA: Diagnosis not present

## 2019-09-01 DIAGNOSIS — Z23 Encounter for immunization: Secondary | ICD-10-CM | POA: Diagnosis not present

## 2019-09-24 DIAGNOSIS — Z79899 Other long term (current) drug therapy: Secondary | ICD-10-CM | POA: Diagnosis not present

## 2019-09-24 DIAGNOSIS — J302 Other seasonal allergic rhinitis: Secondary | ICD-10-CM | POA: Diagnosis not present

## 2019-09-24 DIAGNOSIS — Z853 Personal history of malignant neoplasm of breast: Secondary | ICD-10-CM | POA: Diagnosis not present

## 2019-09-24 DIAGNOSIS — Z87891 Personal history of nicotine dependence: Secondary | ICD-10-CM | POA: Diagnosis not present

## 2019-09-24 DIAGNOSIS — E7800 Pure hypercholesterolemia, unspecified: Secondary | ICD-10-CM | POA: Diagnosis not present

## 2019-09-24 DIAGNOSIS — I714 Abdominal aortic aneurysm, without rupture: Secondary | ICD-10-CM | POA: Diagnosis not present

## 2019-10-23 DIAGNOSIS — I712 Thoracic aortic aneurysm, without rupture: Secondary | ICD-10-CM | POA: Diagnosis not present

## 2019-12-21 DIAGNOSIS — N6001 Solitary cyst of right breast: Secondary | ICD-10-CM | POA: Diagnosis not present

## 2019-12-21 DIAGNOSIS — R928 Other abnormal and inconclusive findings on diagnostic imaging of breast: Secondary | ICD-10-CM | POA: Diagnosis not present

## 2019-12-21 DIAGNOSIS — Z853 Personal history of malignant neoplasm of breast: Secondary | ICD-10-CM | POA: Diagnosis not present

## 2019-12-21 DIAGNOSIS — E559 Vitamin D deficiency, unspecified: Secondary | ICD-10-CM | POA: Diagnosis not present

## 2019-12-21 DIAGNOSIS — C50411 Malignant neoplasm of upper-outer quadrant of right female breast: Secondary | ICD-10-CM | POA: Diagnosis not present

## 2019-12-22 DIAGNOSIS — Z853 Personal history of malignant neoplasm of breast: Secondary | ICD-10-CM | POA: Diagnosis not present

## 2019-12-22 DIAGNOSIS — C50411 Malignant neoplasm of upper-outer quadrant of right female breast: Secondary | ICD-10-CM | POA: Diagnosis not present

## 2020-03-24 DIAGNOSIS — Z1509 Genetic susceptibility to other malignant neoplasm: Secondary | ICD-10-CM | POA: Diagnosis not present

## 2020-03-24 DIAGNOSIS — Z7982 Long term (current) use of aspirin: Secondary | ICD-10-CM | POA: Diagnosis not present

## 2020-03-24 DIAGNOSIS — E7800 Pure hypercholesterolemia, unspecified: Secondary | ICD-10-CM | POA: Diagnosis not present

## 2020-03-24 DIAGNOSIS — Z853 Personal history of malignant neoplasm of breast: Secondary | ICD-10-CM | POA: Diagnosis not present

## 2020-03-24 DIAGNOSIS — Z87891 Personal history of nicotine dependence: Secondary | ICD-10-CM | POA: Diagnosis not present

## 2020-03-24 DIAGNOSIS — Z1331 Encounter for screening for depression: Secondary | ICD-10-CM | POA: Diagnosis not present

## 2020-03-24 DIAGNOSIS — J309 Allergic rhinitis, unspecified: Secondary | ICD-10-CM | POA: Diagnosis not present

## 2020-03-24 DIAGNOSIS — M810 Age-related osteoporosis without current pathological fracture: Secondary | ICD-10-CM | POA: Diagnosis not present

## 2020-03-24 DIAGNOSIS — Z79899 Other long term (current) drug therapy: Secondary | ICD-10-CM | POA: Diagnosis not present

## 2020-03-24 DIAGNOSIS — K5730 Diverticulosis of large intestine without perforation or abscess without bleeding: Secondary | ICD-10-CM | POA: Diagnosis not present

## 2020-07-04 DIAGNOSIS — Z853 Personal history of malignant neoplasm of breast: Secondary | ICD-10-CM | POA: Diagnosis not present

## 2020-07-04 DIAGNOSIS — E559 Vitamin D deficiency, unspecified: Secondary | ICD-10-CM | POA: Diagnosis not present

## 2020-07-04 DIAGNOSIS — C50411 Malignant neoplasm of upper-outer quadrant of right female breast: Secondary | ICD-10-CM | POA: Diagnosis not present

## 2020-07-05 DIAGNOSIS — Z853 Personal history of malignant neoplasm of breast: Secondary | ICD-10-CM | POA: Diagnosis not present

## 2020-07-05 DIAGNOSIS — M8589 Other specified disorders of bone density and structure, multiple sites: Secondary | ICD-10-CM | POA: Diagnosis not present

## 2020-07-05 DIAGNOSIS — Z13820 Encounter for screening for osteoporosis: Secondary | ICD-10-CM | POA: Diagnosis not present

## 2020-07-05 DIAGNOSIS — Z780 Asymptomatic menopausal state: Secondary | ICD-10-CM | POA: Diagnosis not present

## 2020-07-05 DIAGNOSIS — C50411 Malignant neoplasm of upper-outer quadrant of right female breast: Secondary | ICD-10-CM | POA: Diagnosis not present

## 2020-07-14 ENCOUNTER — Ambulatory Visit (INDEPENDENT_AMBULATORY_CARE_PROVIDER_SITE_OTHER): Payer: Medicare Other | Admitting: Obstetrics and Gynecology

## 2020-07-14 ENCOUNTER — Encounter: Payer: Self-pay | Admitting: Obstetrics and Gynecology

## 2020-07-14 ENCOUNTER — Other Ambulatory Visit: Payer: Self-pay

## 2020-07-14 VITALS — BP 122/76 | Ht 67.0 in | Wt 177.0 lb

## 2020-07-14 DIAGNOSIS — M810 Age-related osteoporosis without current pathological fracture: Secondary | ICD-10-CM

## 2020-07-14 DIAGNOSIS — Z9289 Personal history of other medical treatment: Secondary | ICD-10-CM | POA: Diagnosis not present

## 2020-07-14 DIAGNOSIS — Z853 Personal history of malignant neoplasm of breast: Secondary | ICD-10-CM

## 2020-07-14 DIAGNOSIS — Z01419 Encounter for gynecological examination (general) (routine) without abnormal findings: Secondary | ICD-10-CM | POA: Diagnosis not present

## 2020-07-14 NOTE — Progress Notes (Signed)
Mackenzie Norton 1951-09-22 [id number]  SUBJECTIVE:  69 y.o. G0P0 female here for a breast and pelvic exam. She has no gynecologic concerns.  Current Outpatient Medications  Medication Sig Dispense Refill  . aspirin 81 MG tablet Take 81 mg by mouth daily.    . Biotin 10 MG TABS Take by mouth daily.      . Calcium-Vitamin D 600-200 MG-UNIT per tablet Take 1 tablet by mouth 2 (two) times daily.      . Cholecalciferol (VITAMIN D3) 1000 UNITS CAPS Take 2,000 Units by mouth daily.      Marland Kitchen CRANBERRY EXTRACT PO Take 300 mg by mouth.    . fexofenadine (ALLEGRA) 30 MG tablet Take 30 mg by mouth as needed.     . fish oil-omega-3 fatty acids 1000 MG capsule Take 1 g by mouth daily.      . Glucosamine-Chondroit-Vit C-Mn (GLUCOSAMINE CHONDR 500 COMPLEX PO) Take by mouth.      Marland Kitchen L-Methylfolate-Algae-B12-B6 (METANX PO) Take by mouth.    . losartan (COZAAR) 25 MG tablet TK 1 T PO  D  11  . Magnesium (ESSENTIAL MAGNESIUM) 250 MG TABS Take by mouth daily.      . NON FORMULARY Digestive enzyme    . Nutritional Supplements (JUICE PLUS FIBRE PO) Take by mouth daily.      Marland Kitchen OVER THE COUNTER MEDICATION Allergy eye drops     No current facility-administered medications for this visit.   Allergies: Penicillins  No LMP recorded. Patient is postmenopausal.  Past medical history,surgical history, problem list, medications, allergies, family history and social history were all reviewed and documented as reviewed in the EPIC chart.  GYN ROS: no abnormal bleeding, pelvic pain or discharge, no breast pain or new or enlarging lumps on self exam.  No dysuria, frequency, burning, pain with urination, cloudy/malodorous urine.   OBJECTIVE:  BP 122/76   Ht 5\' 7"  (1.702 m)   Wt 177 lb (80.3 kg)   BMI 27.72 kg/m  The patient appears well, alert, oriented, in no distress.  BREAST EXAM: breasts appear normal, no suspicious masses, no skin or nipple changes or axillary nodes  PELVIC EXAM: VULVA: normal appearing  vulva with atrophic change, no masses, tenderness or lesions, VAGINA: normal appearing vagina with trophic change, normal color and discharge, no lesions, CERVIX: surgically absent, UTERUS: surgically absent, vaginal cuff normal, ADNEXA: no masses, nontender, RECTAL: declined by the patient  Chaperone: Aurora Mask (DNP student) present during the examination and performed the pelvic exam with me in attendance to confirm the exam findings  ASSESSMENT:  69 y.o. G0P0 here for a breast and pelvic exam  PLAN:   1. Postmenopausal. 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  No significant hot flashes or night sweats.  No vaginal bleeding. 2. Pap smear 06/2017.  No significant history of abnormal Pap smears.  Reviewed the current screening guidelines and based on age and prior hysterectomy, she is comfortable with not continuing screening at this time. 3.  History of right breast cancer.  Mammogram 06/2020.  Normal breast exam today/NED.  She will continue to follow with her oncologist, she just had her 5-year survivorship visit and she is congratulated on remaining cancer free. 4. Colonoscopy 2019.  She will follow up at the interval recommended by her GI specialist.  History of Lynch syndrome.  She says she does have a colonoscopy coming up again this fall. 5.  Osteoporosis.  DEXA 06/2020.  She does bring a paper  copy of the report.  T score -2.5 in the radius.  She remains on Zometa and is followed by her physicians at [facility] in regard to bone health.   6. Health maintenance.  No labs today as she normally has these completed elsewhere.  Return annually or sooner, prn.  Joseph Pierini MD 07/14/20

## 2020-07-21 DIAGNOSIS — Z86018 Personal history of other benign neoplasm: Secondary | ICD-10-CM | POA: Diagnosis not present

## 2020-07-21 DIAGNOSIS — L814 Other melanin hyperpigmentation: Secondary | ICD-10-CM | POA: Diagnosis not present

## 2020-07-21 DIAGNOSIS — Z85828 Personal history of other malignant neoplasm of skin: Secondary | ICD-10-CM | POA: Diagnosis not present

## 2020-07-21 DIAGNOSIS — D1801 Hemangioma of skin and subcutaneous tissue: Secondary | ICD-10-CM | POA: Diagnosis not present

## 2020-07-21 DIAGNOSIS — L821 Other seborrheic keratosis: Secondary | ICD-10-CM | POA: Diagnosis not present

## 2020-07-21 DIAGNOSIS — L304 Erythema intertrigo: Secondary | ICD-10-CM | POA: Diagnosis not present

## 2020-07-21 DIAGNOSIS — L578 Other skin changes due to chronic exposure to nonionizing radiation: Secondary | ICD-10-CM | POA: Diagnosis not present

## 2020-07-21 DIAGNOSIS — L570 Actinic keratosis: Secondary | ICD-10-CM | POA: Diagnosis not present

## 2020-07-26 DIAGNOSIS — H903 Sensorineural hearing loss, bilateral: Secondary | ICD-10-CM | POA: Diagnosis not present

## 2020-08-25 DIAGNOSIS — M810 Age-related osteoporosis without current pathological fracture: Secondary | ICD-10-CM | POA: Diagnosis not present

## 2020-08-25 DIAGNOSIS — E7800 Pure hypercholesterolemia, unspecified: Secondary | ICD-10-CM | POA: Diagnosis not present

## 2020-08-25 DIAGNOSIS — Z853 Personal history of malignant neoplasm of breast: Secondary | ICD-10-CM | POA: Diagnosis not present

## 2020-08-25 DIAGNOSIS — K5730 Diverticulosis of large intestine without perforation or abscess without bleeding: Secondary | ICD-10-CM | POA: Diagnosis not present

## 2020-08-25 DIAGNOSIS — J309 Allergic rhinitis, unspecified: Secondary | ICD-10-CM | POA: Diagnosis not present

## 2020-08-25 DIAGNOSIS — Z1509 Genetic susceptibility to other malignant neoplasm: Secondary | ICD-10-CM | POA: Diagnosis not present

## 2020-08-29 DIAGNOSIS — Z8481 Family history of carrier of genetic disease: Secondary | ICD-10-CM | POA: Diagnosis not present

## 2020-08-29 DIAGNOSIS — M810 Age-related osteoporosis without current pathological fracture: Secondary | ICD-10-CM | POA: Diagnosis not present

## 2020-08-29 DIAGNOSIS — Z23 Encounter for immunization: Secondary | ICD-10-CM | POA: Diagnosis not present

## 2020-08-29 DIAGNOSIS — Z87891 Personal history of nicotine dependence: Secondary | ICD-10-CM | POA: Diagnosis not present

## 2020-08-29 DIAGNOSIS — E7800 Pure hypercholesterolemia, unspecified: Secondary | ICD-10-CM | POA: Diagnosis not present

## 2020-08-29 DIAGNOSIS — I714 Abdominal aortic aneurysm, without rupture: Secondary | ICD-10-CM | POA: Diagnosis not present

## 2020-08-29 DIAGNOSIS — Z853 Personal history of malignant neoplasm of breast: Secondary | ICD-10-CM | POA: Diagnosis not present

## 2020-08-29 DIAGNOSIS — Z79899 Other long term (current) drug therapy: Secondary | ICD-10-CM | POA: Diagnosis not present

## 2020-10-24 DIAGNOSIS — I712 Thoracic aortic aneurysm, without rupture: Secondary | ICD-10-CM | POA: Diagnosis not present

## 2020-11-02 DIAGNOSIS — Z1509 Genetic susceptibility to other malignant neoplasm: Secondary | ICD-10-CM | POA: Diagnosis not present

## 2020-11-02 DIAGNOSIS — K5730 Diverticulosis of large intestine without perforation or abscess without bleeding: Secondary | ICD-10-CM | POA: Diagnosis not present

## 2020-11-02 DIAGNOSIS — Z9889 Other specified postprocedural states: Secondary | ICD-10-CM | POA: Diagnosis not present

## 2021-07-18 ENCOUNTER — Encounter: Payer: Medicare Other | Admitting: Obstetrics and Gynecology

## 2021-07-18 ENCOUNTER — Ambulatory Visit: Payer: Medicare Other | Admitting: Nurse Practitioner

## 2021-07-18 ENCOUNTER — Ambulatory Visit: Payer: Self-pay | Admitting: Nurse Practitioner

## 2021-08-16 ENCOUNTER — Other Ambulatory Visit: Payer: Self-pay

## 2021-08-16 ENCOUNTER — Encounter: Payer: Self-pay | Admitting: Nurse Practitioner

## 2021-08-16 ENCOUNTER — Ambulatory Visit (INDEPENDENT_AMBULATORY_CARE_PROVIDER_SITE_OTHER): Payer: Medicare Other | Admitting: Nurse Practitioner

## 2021-08-16 VITALS — BP 134/84 | Ht 67.0 in | Wt 185.0 lb

## 2021-08-16 DIAGNOSIS — Z01419 Encounter for gynecological examination (general) (routine) without abnormal findings: Secondary | ICD-10-CM | POA: Diagnosis not present

## 2021-08-16 DIAGNOSIS — Z853 Personal history of malignant neoplasm of breast: Secondary | ICD-10-CM

## 2021-08-16 DIAGNOSIS — Z8739 Personal history of other diseases of the musculoskeletal system and connective tissue: Secondary | ICD-10-CM

## 2021-08-16 DIAGNOSIS — Z780 Asymptomatic menopausal state: Secondary | ICD-10-CM

## 2021-08-16 NOTE — Progress Notes (Signed)
   [place] Scheiderer Helbing 1951-03-22 [id number]   History:  70 y.o. G0 presents for breast and pelvic exam. Postmenopausal - no HRT. S/P 2017 TAH RSO for lynch syndrome, prior 1990 LSO. Normal pap history. 2016 right breast cancer. History of osteoporosis managed by [facility], was on Zometa in the past.   Gynecologic History No LMP recorded. Patient is postmenopausal.   Contraception: post menopausal status  Health Maintenance Last Pap: 06/2017. Results were: Normal Last mammogram: 06/26/2021. Results were: Normal Last colonoscopy: 2020. Results were: Normal, 1-year recall Last Dexa: 07/05/2020  Past medical history, past surgical history, family history and social history were all reviewed and documented in the EPIC chart. Widowed. Retired.   ROS:  A ROS was performed and pertinent positives and negatives are included.  Exam:  Vitals:   08/16/21 1327  BP: 134/84  Weight: 185 lb (83.9 kg)  Height: 5\' 7"  (1.702 m)   Body mass index is 28.98 kg/m.  General appearance:  Normal Thyroid:  Symmetrical, normal in size, without palpable masses or nodularity. Respiratory  Auscultation:  Clear without wheezing or rhonchi Cardiovascular  Auscultation:  Regular rate, without rubs, murmurs or gallops  Edema/varicosities:  Not grossly evident Abdominal  Soft,nontender, without masses, guarding or rebound.  Liver/spleen:  No organomegaly noted  Hernia:  None appreciated  Skin  Inspection:  Grossly normal Breasts: Examined lying and sitting.   Right: Without masses, retractions, nipple discharge or axillary adenopathy.   Left: Without masses, retractions, nipple discharge or axillary adenopathy. Genitourinary   Inguinal/mons:  Normal without inguinal adenopathy  External genitalia:  Normal appearing vulva with no masses, tenderness, or lesions  BUS/Urethra/Skene's glands:  Normal  Vagina:  Atrophic changes  Cervix:  Absent  Uterus:  Absent  Adnexa/parametria:     Rt: Normal in size, without  masses or tenderness.   Lt: Normal in size, without masses or tenderness.  Anus and perineum: Normal  Patient informed chaperone available to be present for breast and pelvic exam. Patient has requested no chaperone to be present. Patient has been advised what will be completed during breast and pelvic exam.   Assessment/Plan:  70 y.o. G0 for breast and pelvic exam.   Well female exam with routine gynecological exam - Education provided on SBEs, importance of preventative screenings, current guidelines, high calcium diet, regular exercise, and multivitamin daily. Labs done elsewhere.   History of right breast cancer - 2016, managed with lumpectomy, chemo, and radiation. Sees oncology every 6 months. Continue annual screenings.   History of osteoporosis - managed by [facility], was on Zometa in the past.   Postmenopausal - no HRT. S/P 2017 TAH RSO for lynch syndrome, prior 1990 LSO.  Screening for cervical cancer - Normal Pap history. No longer screening per guidelines.   Screening for colon cancer - 2020 colonoscopy. Annual screenings due to Lynch syndrome.   Return in 1 year for breast and pelvic exam.   Tamela Gammon DNP, 1:51 PM 08/16/2021

## 2022-08-20 ENCOUNTER — Encounter: Payer: Self-pay | Admitting: Nurse Practitioner

## 2022-08-20 ENCOUNTER — Ambulatory Visit (INDEPENDENT_AMBULATORY_CARE_PROVIDER_SITE_OTHER): Payer: Medicare Other | Admitting: Nurse Practitioner

## 2022-08-20 VITALS — BP 104/68 | HR 91 | Resp 20 | Ht 66.34 in | Wt 192.4 lb

## 2022-08-20 DIAGNOSIS — Z01419 Encounter for gynecological examination (general) (routine) without abnormal findings: Secondary | ICD-10-CM | POA: Diagnosis not present

## 2022-08-20 DIAGNOSIS — Z853 Personal history of malignant neoplasm of breast: Secondary | ICD-10-CM

## 2022-08-20 DIAGNOSIS — Z780 Asymptomatic menopausal state: Secondary | ICD-10-CM

## 2022-08-20 DIAGNOSIS — M810 Age-related osteoporosis without current pathological fracture: Secondary | ICD-10-CM

## 2022-08-20 NOTE — Progress Notes (Signed)
   Mackenzie Norton [birth year] [id number]   History:  71 y.o. G0 presents for breast and pelvic exam. Postmenopausal - no HRT. S/P 2017 TAH RSO for lynch syndrome, prior 1990 LSO. Normal pap history. 2016 right breast cancer. History of osteoporosis managed by [facility], was on Zometa in the past.   Gynecologic History No LMP recorded. Patient is postmenopausal.   Contraception: post menopausal status Sexually active: No  Health Maintenance Last Pap: 07/02/2017. Results were: Normal Last mammogram: 05/29/2022. Results were: Normal Last colonoscopy: 05/28/2022, 2-year recall d/t Lynch syndrome Last Dexa: 05/29/2022. Results were; T-score -1.3  Past medical history, past surgical history, family history and social history were all reviewed and documented in the EPIC chart. Widowed. Retired.   ROS:  A ROS was performed and pertinent positives and negatives are included.  Exam:  Vitals:   08/20/22 1321  BP: 104/68  Pulse: 91  Resp: 20  SpO2: 98%  Weight: 192 lb 6.4 oz (87.3 kg)  Height: 5' 6.34" (1.685 m)    Body mass index is 30.74 kg/m.  General appearance:  Normal Thyroid:  Symmetrical, normal in size, without palpable masses or nodularity. Respiratory  Auscultation:  Clear without wheezing or rhonchi Cardiovascular  Auscultation:  Regular rate, without rubs, murmurs or gallops  Edema/varicosities:  Not grossly evident Abdominal  Soft,nontender, without masses, guarding or rebound.  Liver/spleen:  No organomegaly noted  Hernia:  None appreciated  Skin  Inspection:  Grossly normal Breasts: Examined lying and sitting.   Right: Without masses, retractions, nipple discharge or axillary adenopathy.   Left: Without masses, retractions, nipple discharge or axillary adenopathy. Genitourinary   Inguinal/mons:  Normal without inguinal adenopathy  External genitalia:  Normal appearing vulva with no masses, tenderness, or lesions  BUS/Urethra/Skene's glands:  Normal  Vagina:  Atrophic  changes  Cervix:  Absent  Uterus:  Absent  Adnexa/parametria:     Rt: Normal in size, without masses or tenderness.   Lt: Normal in size, without masses or tenderness.  Anus and perineum: Normal  Patient informed chaperone available to be present for breast and pelvic exam. Patient has requested no chaperone to be present. Patient has been advised what will be completed during breast and pelvic exam.   Assessment/Plan:  71 y.o. G0 for breast and pelvic exam.   Well female exam with routine gynecological exam - Education provided on SBEs, importance of preventative screenings, current guidelines, high calcium diet, regular exercise, and multivitamin daily. Labs done elsewhere.   History of right breast cancer - 2016, managed with lumpectomy, chemo, and radiation. Sees oncology every 6 months. Continue annual screenings.   History of osteoporosis - managed by [facility], was on Zometa in the past. Most recent T-score -1.3 in July.   Postmenopausal - no HRT. S/P 2017 TAH RSO for lynch syndrome, prior 1990 LSO.  Screening for cervical cancer - Normal Pap history. No longer screening per guidelines.   Screening for colon cancer - 05/2022 colonoscopy. Will repeat in 2 years per GI recommendation.   Return in 2 years for breast and pelvic exam.     Tamela Gammon DNP, 1:56 PM 08/20/2022

## 2023-08-22 ENCOUNTER — Ambulatory Visit: Payer: Medicare Other | Admitting: Nurse Practitioner

## 2023-08-22 NOTE — Progress Notes (Deleted)
Mackenzie Norton 27-Aug-1951 [id number]   History:  72 y.o. G0 presents for breast and pelvic exam. Postmenopausal - no HRT. S/P 2017 TAH RSO for lynch syndrome, prior 1990 LSO. Normal pap history. 2016 right breast cancer. History of osteoporosis managed by [facility], was on Zometa in the past.   Gynecologic History No LMP recorded. Patient is postmenopausal.   Contraception: post menopausal status Sexually active: No  Health Maintenance Last Pap: 07/02/2017. Results were: Normal Last mammogram: 06/10/2023. Results were: Normal Last colonoscopy: 05/28/2022, 2-year recall d/t Lynch syndrome Last Dexa: 05/29/2022. Results were; T-score -1.3  Past medical history, past surgical history, family history and social history were all reviewed and documented in the EPIC chart. Widowed. Retired.   ROS:  A ROS was performed and pertinent positives and negatives are included.  Exam:  There were no vitals filed for this visit.   There is no height or weight on file to calculate BMI.  General appearance:  Normal Thyroid:  Symmetrical, normal in size, without palpable masses or nodularity. Respiratory  Auscultation:  Clear without wheezing or rhonchi Cardiovascular  Auscultation:  Regular rate, without rubs, murmurs or gallops  Edema/varicosities:  Not grossly evident Abdominal  Soft,nontender, without masses, guarding or rebound.  Liver/spleen:  No organomegaly noted  Hernia:  None appreciated  Skin  Inspection:  Grossly normal Breasts: Examined lying and sitting.   Right: Without masses, retractions, nipple discharge or axillary adenopathy.   Left: Without masses, retractions, nipple discharge or axillary adenopathy. Pelvic: External genitalia:  no lesions              Urethra:  normal appearing urethra with no masses, tenderness or lesions              Bartholins and Skenes: normal                 Vagina: normal appearing vagina with normal color and discharge, no lesions               Cervix: absent Bimanual Exam:  Uterus:  absent              Adnexa: no mass, fullness, tenderness              Rectovaginal: Deferred              Anus:  normal, no lesions  Patient informed chaperone available to be present for breast and pelvic exam. Patient has requested no chaperone to be present. Patient has been advised what will be completed during breast and pelvic exam.   Assessment/Plan:  72 y.o. G0 for breast and pelvic exam.   Well female exam with routine gynecological exam - Education provided on SBEs, importance of preventative screenings, current guidelines, high calcium diet, regular exercise, and multivitamin daily. Labs done elsewhere.   History of right breast cancer - 2016, managed with lumpectomy, chemo, and radiation. Sees oncology every 6 months. Continue annual screenings.   History of osteoporosis - managed by [facility], was on Zometa in the past. Most recent T-score -1.3 in July.   Postmenopausal - no HRT. S/P 2017 TAH RSO for lynch syndrome, prior 1990 LSO.  Screening for cervical cancer - Normal Pap history. No longer screening per guidelines.   Screening for colon cancer - 05/2022 colonoscopy. Will repeat in 2 years per GI recommendation.   Return in 2 years for breast and pelvic exam.     Olivia Mackie DNP, 11:53 AM 08/22/2023

## 2024-08-24 ENCOUNTER — Encounter: Payer: Medicare Other | Admitting: Nurse Practitioner

## 2024-08-26 ENCOUNTER — Encounter: Payer: Self-pay | Admitting: Nurse Practitioner

## 2024-08-26 ENCOUNTER — Ambulatory Visit: Admitting: Nurse Practitioner

## 2024-08-26 VITALS — BP 112/72 | HR 90 | Ht 66.75 in | Wt 189.0 lb

## 2024-08-26 DIAGNOSIS — Z1507 Genetic susceptibility to malignant neoplasm of urinary tract: Secondary | ICD-10-CM

## 2024-08-26 DIAGNOSIS — Z780 Asymptomatic menopausal state: Secondary | ICD-10-CM

## 2024-08-26 DIAGNOSIS — Z01419 Encounter for gynecological examination (general) (routine) without abnormal findings: Secondary | ICD-10-CM

## 2024-08-26 DIAGNOSIS — Z1509 Genetic susceptibility to other malignant neoplasm: Secondary | ICD-10-CM

## 2024-08-26 DIAGNOSIS — Z15060 Genetic susceptibility to colorectal cancer: Secondary | ICD-10-CM

## 2024-08-26 DIAGNOSIS — Z15068 Genetic susceptibility to other malignant neoplasm of digestive system: Secondary | ICD-10-CM

## 2024-08-26 DIAGNOSIS — M8580 Other specified disorders of bone density and structure, unspecified site: Secondary | ICD-10-CM

## 2024-08-26 DIAGNOSIS — Z853 Personal history of malignant neoplasm of breast: Secondary | ICD-10-CM

## 2024-08-26 NOTE — Progress Notes (Signed)
 Mackenzie Norton 21-Nov-1950 [id number]   History:  73 y.o. G0 presents for breast and pelvic exam. Postmenopausal - no HRT. S/P 2017 TAH RSO for lynch syndrome, prior 1999 LSO. Normal pap history. 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 History of osteoporosis managed by [facility], was on Zometa  in the past.   Gynecologic History No LMP recorded. Patient is postmenopausal.   Contraception: post menopausal status Sexually active: No  Health Maintenance Last Pap: 07/02/2017. Results were: Normal Last mammogram: 06/09/2024. Results were: Normal Last colonoscopy: 08/12/2024, 3-year recall d/t Lynch syndrome Last Dexa: 06/08/2024. Results were; T-score -1.3  Past medical history, past surgical history, family history and social history were all reviewed and documented in the EPIC chart. Widowed. Retired. Loves to read, involved in church/community.   ROS:  A ROS was performed and pertinent positives and negatives are included.  Exam:  Vitals:   08/26/24 1154  BP: 112/72  Pulse: 90  SpO2: 96%  Weight: 189 lb (85.7 kg)  Height: 5' 6.75 (1.695 m)     Body mass index is 29.82 kg/m.  General appearance:  Normal Thyroid :  Symmetrical, normal in size, without palpable masses or nodularity. Respiratory  Auscultation:  Clear without wheezing or rhonchi Cardiovascular  Auscultation:  Regular rate, without rubs, murmurs or gallops  Edema/varicosities:  Not grossly evident Abdominal  Soft,nontender, without masses, guarding or rebound.  Liver/spleen:  No organomegaly noted  Hernia:  None appreciated  Skin  Inspection:  Grossly normal Breasts: Examined lying and sitting.   Right: Without masses, retractions, nipple discharge or axillary adenopathy.   Left: Without masses, retractions, nipple discharge or axillary adenopathy. Pelvic: External genitalia:  no lesions              Urethra:  normal appearing urethra with no masses, tenderness or lesions              Bartholins and Skenes:  normal                 Vagina: normal appearing vagina with normal color and discharge, no lesions. Atrophic changes              Cervix: absent Bimanual Exam:  Uterus: absent              Adnexa: no mass, fullness, tenderness              Rectovaginal: Deferred              Anus:  normal, no lesions  Mackenzie Norton, CMA present as chaperone.   Assessment/Plan:  73 y.o. G0 for breast and pelvic exam.   Encounter for breast and pelvic examination - Education provided on SBEs, importance of preventative screenings, current guidelines, high calcium diet, regular exercise, and multivitamin daily. Labs done elsewhere.   History of right breast cancer - 2016, managed with lumpectomy, chemo, and radiation. Sees oncology every 6 months. Continue annual screenings.   Osteopenia, unspecified location - managed by [facility], was on Zometa  in the past. Most recent T-score -1.3 in July. Works out with trainer a couple of days per week.    Lynch syndrome - followed by oncology. UTD on screenings.   Postmenopausal - no HRT. S/P 2017 TAH RSO for lynch syndrome, prior 1999 LSO.  Screening for cervical cancer - Normal Pap history. No longer screening per guidelines.   Screening for colon cancer - 05/2024 colonoscopy. Will repeat in 3 years per GI recommendation.   Return in about 2 years (around 08/26/2026)  for B&P.     Mackenzie DELENA Shutter DNP, 12:21 PM 08/26/2024
